# Patient Record
Sex: Female | Born: 1955 | Race: White | Hispanic: No | Marital: Married | Smoking: Former smoker
Health system: Southern US, Community
[De-identification: ages and names within clinical notes are randomized; demographics above are authoritative.]

## PROBLEM LIST (undated history)

## (undated) DIAGNOSIS — J45909 Unspecified asthma, uncomplicated: Secondary | ICD-10-CM

## (undated) DIAGNOSIS — M1990 Unspecified osteoarthritis, unspecified site: Secondary | ICD-10-CM

## (undated) DIAGNOSIS — K219 Gastro-esophageal reflux disease without esophagitis: Secondary | ICD-10-CM

## (undated) DIAGNOSIS — J449 Chronic obstructive pulmonary disease, unspecified: Secondary | ICD-10-CM

## (undated) DIAGNOSIS — I10 Essential (primary) hypertension: Secondary | ICD-10-CM

## (undated) HISTORY — PX: BACK SURGERY: SHX140

---

## 2005-05-30 ENCOUNTER — Ambulatory Visit: Payer: Self-pay | Admitting: Specialist

## 2008-07-28 ENCOUNTER — Emergency Department: Payer: Self-pay | Admitting: Emergency Medicine

## 2016-08-21 ENCOUNTER — Emergency Department: Payer: Medicare Other

## 2016-08-21 ENCOUNTER — Inpatient Hospital Stay
Admission: EM | Admit: 2016-08-21 | Discharge: 2016-08-23 | DRG: 193 | Disposition: A | Discharge status: Home / Self Care | Payer: Medicare Other | Attending: Internal Medicine | Admitting: Internal Medicine

## 2016-08-21 DIAGNOSIS — K219 Gastro-esophageal reflux disease without esophagitis: Secondary | ICD-10-CM | POA: Diagnosis present

## 2016-08-21 DIAGNOSIS — I10 Essential (primary) hypertension: Secondary | ICD-10-CM | POA: Diagnosis present

## 2016-08-21 DIAGNOSIS — J189 Pneumonia, unspecified organism: Principal | ICD-10-CM | POA: Diagnosis present

## 2016-08-21 DIAGNOSIS — J9621 Acute and chronic respiratory failure with hypoxia: Secondary | ICD-10-CM | POA: Diagnosis present

## 2016-08-21 DIAGNOSIS — J181 Lobar pneumonia, unspecified organism: Secondary | ICD-10-CM

## 2016-08-21 DIAGNOSIS — E876 Hypokalemia: Secondary | ICD-10-CM | POA: Diagnosis present

## 2016-08-21 DIAGNOSIS — Z87891 Personal history of nicotine dependence: Secondary | ICD-10-CM

## 2016-08-21 DIAGNOSIS — J441 Chronic obstructive pulmonary disease with (acute) exacerbation: Secondary | ICD-10-CM | POA: Diagnosis present

## 2016-08-21 DIAGNOSIS — D72829 Elevated white blood cell count, unspecified: Secondary | ICD-10-CM

## 2016-08-21 DIAGNOSIS — E871 Hypo-osmolality and hyponatremia: Secondary | ICD-10-CM | POA: Diagnosis present

## 2016-08-21 DIAGNOSIS — E119 Type 2 diabetes mellitus without complications: Secondary | ICD-10-CM

## 2016-08-21 DIAGNOSIS — E440 Moderate protein-calorie malnutrition: Secondary | ICD-10-CM | POA: Diagnosis present

## 2016-08-21 DIAGNOSIS — J440 Chronic obstructive pulmonary disease with acute lower respiratory infection: Secondary | ICD-10-CM | POA: Diagnosis present

## 2016-08-21 DIAGNOSIS — R0781 Pleurodynia: Secondary | ICD-10-CM

## 2016-08-21 DIAGNOSIS — Z881 Allergy status to other antibiotic agents status: Secondary | ICD-10-CM

## 2016-08-21 DIAGNOSIS — R1011 Right upper quadrant pain: Secondary | ICD-10-CM

## 2016-08-21 DIAGNOSIS — I499 Cardiac arrhythmia, unspecified: Secondary | ICD-10-CM

## 2016-08-21 HISTORY — DX: Gastro-esophageal reflux disease without esophagitis: K21.9

## 2016-08-21 HISTORY — DX: Essential (primary) hypertension: I10

## 2016-08-21 HISTORY — DX: Unspecified osteoarthritis, unspecified site: M19.90

## 2016-08-21 HISTORY — DX: Unspecified asthma, uncomplicated: J45.909

## 2016-08-21 HISTORY — DX: Chronic obstructive pulmonary disease, unspecified: J44.9

## 2016-08-21 LAB — BASIC METABOLIC PANEL
ANION GAP: 9 (ref 5–15)
BUN: 9 mg/dL (ref 6–20)
CALCIUM: 9.1 mg/dL (ref 8.9–10.3)
CO2: 31 mmol/L (ref 22–32)
Chloride: 95 mmol/L — ABNORMAL LOW (ref 101–111)
Creatinine, Ser: 0.61 mg/dL (ref 0.44–1.00)
GFR calc non Af Amer: >60 mL/min (ref >60)
Glucose, Bld: 203 mg/dL — ABNORMAL HIGH (ref 65–99)
Potassium: 3.2 mmol/L — ABNORMAL LOW (ref 3.5–5.1)
SODIUM: 135 mmol/L (ref 135–145)

## 2016-08-21 LAB — CBC WITH DIFFERENTIAL/PLATELET
BASOS ABS: 0.1 10*3/uL (ref 0–0.1)
Basophils Relative: 1 %
EOS PCT: 1 %
Eosinophils Absolute: 0.2 10*3/uL (ref 0–0.7)
HEMATOCRIT: 31.3 % — AB (ref 35.0–47.0)
Hemoglobin: 10.8 g/dL — ABNORMAL LOW (ref 12.0–16.0)
Lymphocytes Relative: 11 %
Lymphs Abs: 1.3 10*3/uL (ref 1.0–3.6)
MCH: 29.2 pg (ref 26.0–34.0)
MCHC: 34.7 g/dL (ref 32.0–36.0)
MCV: 84.1 fL (ref 80.0–100.0)
MONO ABS: 1.3 10*3/uL — AB (ref 0.2–0.9)
Monocytes Relative: 11 %
NEUTROS ABS: 9.4 10*3/uL — AB (ref 1.4–6.5)
Neutrophils Relative %: 76 %
PLATELETS: 233 10*3/uL (ref 150–440)
RBC: 3.72 MIL/uL — ABNORMAL LOW (ref 3.80–5.20)
RDW: 13.4 % (ref 11.5–14.5)
WBC: 12.3 10*3/uL — ABNORMAL HIGH (ref 3.6–11.0)

## 2016-08-21 LAB — TROPONIN I: Troponin I: <0.03 ng/mL (ref <0.03)

## 2016-08-21 MED ORDER — ALBUTEROL SULFATE (2.5 MG/3ML) 0.083% IN NEBU
7.5000 mg | INHALATION_SOLUTION | Freq: Once | RESPIRATORY_TRACT | Status: AC
  Administered 2016-08-21: 7.5 mg via RESPIRATORY_TRACT
  Filled 2016-08-21: qty 9

## 2016-08-21 MED ORDER — CEFTRIAXONE SODIUM IN DEXTROSE 20 MG/ML IV SOLN
1.0000 g | Freq: Once | INTRAVENOUS | Status: AC
  Administered 2016-08-21: 1 g via INTRAVENOUS
  Filled 2016-08-21: qty 50

## 2016-08-21 MED ORDER — DEXTROSE 5 % IV SOLN
500.0000 mg | Freq: Once | INTRAVENOUS | Status: AC
  Administered 2016-08-22: 500 mg via INTRAVENOUS
  Filled 2016-08-21: qty 500

## 2016-08-21 MED ORDER — DEXTROSE 5 % IV SOLN
1.0000 g | INTRAVENOUS | Status: DC
  Administered 2016-08-22: 1 g via INTRAVENOUS
  Filled 2016-08-21 (×2): qty 10

## 2016-08-21 MED ORDER — METHYLPREDNISOLONE SODIUM SUCC 125 MG IJ SOLR
125.0000 mg | Freq: Once | INTRAMUSCULAR | Status: AC
  Administered 2016-08-21: 125 mg via INTRAVENOUS
  Filled 2016-08-21: qty 2

## 2016-08-21 MED ORDER — IPRATROPIUM-ALBUTEROL 0.5-2.5 (3) MG/3ML IN SOLN
6.0000 mL | Freq: Once | RESPIRATORY_TRACT | Status: AC
  Administered 2016-08-21: 6 mL via RESPIRATORY_TRACT
  Filled 2016-08-21: qty 6

## 2016-08-21 NOTE — ED Notes (Signed)
Pt found lying on stretcher with nasal cannula no longer on her nose. Pt encouraged to keep it in place. Verbalized understanding. Family not at bedside but said to return shortly.

## 2016-08-21 NOTE — ED Notes (Signed)
Pt bib EMS w/ c/o AMS and SOB.  Per EMS this began yesterday, family reports that pt has been "spacy", and forgetting things.  Pt fidgeting in room, has difficulty staying still for EKG/BP.  Pt A/OX4, has productive cough.  Pt able to move all limbs on command.  Lung sounds diminished

## 2016-08-21 NOTE — H&P (Signed)
[facility] -  at [facility]   PATIENT NAME: Valerie Norris    MR#:  [id number]  DATE OF BIRTH:  March 19, 1956  DATE OF ADMISSION:  08/21/2016  PRIMARY CARE PHYSICIAN: No primary care provider on file.   REQUESTING/REFERRING PHYSICIAN: Schaevitz  CHIEF COMPLAINT:   Chief Complaint  Patient presents with  . Shortness of Breath    HISTORY OF PRESENT ILLNESS:  Valerie Norris  is a 61 y.o. female who presents with Shortness of breath for the last 3 days. Patient has had significant wheezing and cough. She has COPD, and chest x-ray here in the ED shows pneumonia as well. She required significant respiratory support in the ED, though she did not require BiPAP, and hospitalists were called for admission.  PAST MEDICAL HISTORY:   Past Medical History:  Diagnosis Date  . Arthritis   . Asthma   . COPD (chronic obstructive pulmonary disease) (HCC)   . GERD (gastroesophageal reflux disease)   . Hypertension     PAST SURGICAL HISTORY:   Past Surgical History:  Procedure Laterality Date  . BACK SURGERY      SOCIAL HISTORY:   Social History  Substance Use Topics  . Smoking status: Former Games developer  . Smokeless tobacco: Never Used  . Alcohol use No    FAMILY HISTORY:   Family History  Problem Relation Age of Onset  . Heart attack Mother   . COPD Father   . Heart failure Brother     DRUG ALLERGIES:   Allergies  Allergen Reactions  . Tetracyclines & Related Nausea And Vomiting    MEDICATIONS AT HOME:   Prior to Admission medications   Not on File    REVIEW OF SYSTEMS:  Review of Systems  Constitutional: Positive for malaise/fatigue. Negative for chills, fever and weight loss.  HENT: Negative for ear pain, hearing loss and tinnitus.   Eyes: Negative for blurred vision, double vision, pain and redness.  Respiratory: Positive for cough, shortness of breath and wheezing. Negative for hemoptysis.   Cardiovascular: Negative for chest pain,  palpitations, orthopnea and leg swelling.  Gastrointestinal: Negative for abdominal pain, constipation, diarrhea, nausea and vomiting.  Genitourinary: Negative for dysuria, frequency and hematuria.  Musculoskeletal: Negative for back pain, joint pain and neck pain.  Skin:       No acne, rash, or lesions  Neurological: Negative for dizziness, tremors, focal weakness and weakness.  Endo/Heme/Allergies: Negative for polydipsia. Does not bruise/bleed easily.  Psychiatric/Behavioral: Negative for depression. The patient is not nervous/anxious and does not have insomnia.      VITAL SIGNS:   Vitals:   08/21/16 2053 08/21/16 2100  BP: (!) 150/88 (!) 132/54  Pulse: (!) 112 (!) 102  Resp: 17 (!) 28  Temp: 99 F (37.2 C)   TempSrc: Oral   SpO2: 92% 95%  Weight: 53.1 kg (117 lb)   Height: 5\' 4"  (1.626 m)    Wt Readings from Last 3 Encounters:  08/21/16 53.1 kg (117 lb)    PHYSICAL EXAMINATION:  Physical Exam  Vitals reviewed. Constitutional: She is oriented to person, place, and time. She appears well-developed and well-nourished. No distress.  HENT:  Head: Normocephalic and atraumatic.  Mouth/Throat: Oropharynx is clear and moist.  Eyes: Conjunctivae and EOM are normal. Pupils are equal, round, and reactive to light. No scleral icterus.  Neck: Normal range of motion. Neck supple. No JVD present. No thyromegaly present.  Cardiovascular: Normal rate, regular rhythm and intact distal pulses.  Exam reveals  no gallop and no friction rub.   No murmur heard. Respiratory: She is in respiratory distress. She has wheezes. She has no rales.  GI: Soft. Bowel sounds are normal. She exhibits no distension. There is no tenderness.  Musculoskeletal: Normal range of motion. She exhibits no edema.  No arthritis, no gout  Lymphadenopathy:    She has no cervical adenopathy.  Neurological: She is alert and oriented to person, place, and time. No cranial nerve deficit.  No dysarthria, no aphasia   Skin: Skin is warm and dry. No rash noted. No erythema.  Psychiatric: She has a normal mood and affect. Her behavior is normal. Judgment and thought content normal.    LABORATORY PANEL:   CBC  Recent Labs Lab 08/21/16 2059  WBC 12.3*  HGB 10.8*  HCT 31.3*  PLT 233   ------------------------------------------------------------------------------------------------------------------  Chemistries   Recent Labs Lab 08/21/16 2059  NA 135  K 3.2*  CL 95*  CO2 31  GLUCOSE 203*  BUN 9  CREATININE 0.61  CALCIUM 9.1   ------------------------------------------------------------------------------------------------------------------  Cardiac Enzymes  Recent Labs Lab 08/21/16 2059  TROPONINI <0.03   ------------------------------------------------------------------------------------------------------------------  RADIOLOGY:  Dg Chest 1 View  Result Date: 08/21/2016 CLINICAL DATA:  Shortness of breath EXAM: CHEST 1 VIEW COMPARISON:  CT 05/30/2005 FINDINGS: Hyperinflation with emphysematous disease. Mild opacity at the right lateral lung base may reflect inflammatory infiltrate. No effusion. Normal heart size. No pneumothorax. IMPRESSION: Hyperinflation with emphysematous disease. Mild right lateral basilar opacity may reflect pulmonary infiltrate. Electronically Signed   By: Jasmine PangKim  Fujinaga M.D.   On: 08/21/2016 22:01    EKG:  No orders found for this or any previous visit.  IMPRESSION AND PLAN:  Principal Problem:   CAP (community acquired pneumonia) - IV antibiotics, patient does not meet sepsis criteria, treat COPD exacerbation as below, supportive treatment with antitussives and duo nebs when necessary Active Problems:   COPD with acute exacerbation (HCC) - IV steroids, DuoNeb's and antitussives, continue home meds   HTN (hypertension) - stable, continue home meds   GERD (gastroesophageal reflux disease) - home dose PPI  All the records are reviewed and case discussed  with ED provider. Management plans discussed with the patient and/or family.  DVT PROPHYLAXIS: SubQ lovenox  GI PROPHYLAXIS: PPI  ADMISSION STATUS: Inpatient  CODE STATUS: Full Code Status History    This patient does not have a recorded code status. Please follow your organizational policy for patients in this situation.      TOTAL TIME TAKING CARE OF THIS PATIENT: 45 minutes.   Valerie Norris FIELDING 08/21/2016, 10:39 PM  [facility]  Office  [phone]  CC: Primary care physician; No primary care provider on file.  Note:  This document was prepared using Dragon voice recognition software and may include unintentional dictation errors.

## 2016-08-21 NOTE — ED Provider Notes (Signed)
[facility] Emergency Department Provider Note  ____________________________________________   First MD Initiated Contact with Patient 08/21/16 2045     (approximate)  I have reviewed the triage vital signs and the nursing notes.   HISTORY  Chief Complaint Shortness of Breath   HPI Valerie Norris is a 61 y.o. female with a history of COPD on 3 L of nasal cannula oxygen at her baseline who is presenting with hypoxia as well as increasing shortness of breath today. Family also states that she has had staring spells. When EMS arrived the patient was found to be 84% on her home oxygen. Complained of some pain to EMS arrival. However, denies any chest pain here in the emergency department. Does not report fever.   Past Medical History:  Diagnosis Date  . Arthritis   . Asthma   . COPD (chronic obstructive pulmonary disease) (HCC)   . Hypertension     There are no active problems to display for this patient.   Past Surgical History:  Procedure Laterality Date  . BACK SURGERY      Prior to Admission medications   Not on File    Allergies Tetracyclines & related  No family history on file.  Social History Social History  Substance Use Topics  . Smoking status: Former Games developer  . Smokeless tobacco: Never Used  . Alcohol use Not on file    Review of Systems  Constitutional: No fever/chills Eyes: No visual changes. ENT: No sore throat. Cardiovascular: Denies chest pain. Respiratory: as above  Gastrointestinal: No abdominal pain.  No nausea, no vomiting.  No diarrhea.  No constipation. Genitourinary: Negative for dysuria. Musculoskeletal: Negative for back pain. Skin: Negative for rash. Neurological: Negative for headaches, focal weakness or numbness.   ____________________________________________   PHYSICAL EXAM:  VITAL SIGNS: ED Triage Vitals  Enc Vitals Group     BP 08/21/16 2053 (!) 150/88     Pulse Rate 08/21/16 2053 (!) 112       Resp 08/21/16 2053 17     Temp 08/21/16 2053 99 F (37.2 C)     Temp Source 08/21/16 2053 Oral     SpO2 08/21/16 2053 92 %     Weight 08/21/16 2053 117 lb (53.1 kg)     Height 08/21/16 2053 5\' 4"  (1.626 m)     Head Circumference --      Peak Flow --      Pain Score 08/21/16 2047 4     Pain Loc --      Pain Edu? --      Excl. in GC? --     Constitutional: Alert and oriented.  Eyes: Conjunctivae are normal.  Head: Atraumatic. Nose: No congestion/rhinnorhea. Mouth/Throat: Mucous membranes are moist.  Neck: No stridor.   Cardiovascular: Normal rate, regular rhythm. Grossly normal heart sounds.   Respiratory: Labored respirations with mild supraclavicular retraction.  decreased air movement throughout with prolonged history phase and coarse wheezing. Gastrointestinal: Soft and nontender. No distention.  Musculoskeletal: No lower extremity tenderness nor edema.   Neurologic:  Normal speech and language. No gross focal neurologic deficits are appreciated.  Skin:  Skin is warm, dry and intact. No rash noted. Psychiatric: Mood and affect are normal. Speech and behavior are normal.  ____________________________________________   LABS (all labs ordered are listed, but only abnormal results are displayed)  Labs Reviewed  CBC WITH DIFFERENTIAL/PLATELET - Abnormal; Notable for the following:       Result Value   WBC 12.3 (*)  RBC 3.72 (*)    Hemoglobin 10.8 (*)    HCT 31.3 (*)    Neutro Abs 9.4 (*)    Monocytes Absolute 1.3 (*)    All other components within normal limits  BASIC METABOLIC PANEL  TROPONIN I   ____________________________________________  EKG  ED ECG REPORT I, Pegah Segel,  Teena Iraniavid M, the attending physician, personally viewed and interpreted this ECG.   Date: 08/21/2016  EKG Time: 2051  Rate: 108  Rhythm: sinus tachycardia  Axis: normal  Intervals:none  ST&T Change: No ST segment elevation or depression. No abnormal T-wave  inversion.  ____________________________________________  RADIOLOGY  Right lower lobe pneumonia. ____________________________________________   PROCEDURES  Procedure(s) performed:   Procedures  Critical Care performed:   ____________________________________________   INITIAL IMPRESSION / ASSESSMENT AND PLAN / ED COURSE  Pertinent labs & imaging results that were available during my care of the patient were reviewed by me and considered in my medical decision making (see chart for details).  ----------------------------------------- 10:13 PM on 08/21/2016 -----------------------------------------  Patient with persistent wheezing despite nebs and steroids. We will give further albuterol in the emergency department. Will be admitted to the hospital. We'll also treat for community acquired pneumonia. Patient has not been admitted to the hospital for "years." Explained the plan as well as the diagnosis patient as well as the family. Site of the Dr. Anne HahnWillis.      ____________________________________________   FINAL CLINICAL IMPRESSION(S) / ED DIAGNOSES  COPD exacerbation. Community acquired pneumonia.    NEW MEDICATIONS STARTED DURING THIS VISIT:  New Prescriptions   No medications on file     Note:  This document was prepared using Dragon voice recognition software and may include unintentional dictation errors.    Myrna BlazerSchaevitz, Izella Ybanez Matthew, MD 08/21/16 2214

## 2016-08-22 ENCOUNTER — Inpatient Hospital Stay: Payer: Medicare Other

## 2016-08-22 DIAGNOSIS — E440 Moderate protein-calorie malnutrition: Secondary | ICD-10-CM | POA: Insufficient documentation

## 2016-08-22 LAB — HEPATIC FUNCTION PANEL
ALBUMIN: 3.2 g/dL — AB (ref 3.5–5.0)
ALK PHOS: 69 U/L (ref 38–126)
ALT: 9 U/L — AB (ref 14–54)
AST: 14 U/L — ABNORMAL LOW (ref 15–41)
BILIRUBIN TOTAL: 0.3 mg/dL (ref 0.3–1.2)
Bilirubin, Direct: <0.1 mg/dL — ABNORMAL LOW (ref 0.1–0.5)
Total Protein: 6.6 g/dL (ref 6.5–8.1)

## 2016-08-22 LAB — BASIC METABOLIC PANEL
Anion gap: 7 (ref 5–15)
BUN: 8 mg/dL (ref 6–20)
CALCIUM: 8.9 mg/dL (ref 8.9–10.3)
CO2: 33 mmol/L — ABNORMAL HIGH (ref 22–32)
Chloride: 94 mmol/L — ABNORMAL LOW (ref 101–111)
Creatinine, Ser: 0.53 mg/dL (ref 0.44–1.00)
GFR calc Af Amer: >60 mL/min (ref >60)
GLUCOSE: 347 mg/dL — AB (ref 65–99)
Potassium: 3.5 mmol/L (ref 3.5–5.1)
Sodium: 134 mmol/L — ABNORMAL LOW (ref 135–145)

## 2016-08-22 LAB — GLUCOSE, CAPILLARY
GLUCOSE-CAPILLARY: 220 mg/dL — AB (ref 65–99)
Glucose-Capillary: 239 mg/dL — ABNORMAL HIGH (ref 65–99)

## 2016-08-22 LAB — EXPECTORATED SPUTUM ASSESSMENT W GRAM STAIN, RFLX TO RESP C

## 2016-08-22 LAB — CBC
HCT: 28.7 % — ABNORMAL LOW (ref 35.0–47.0)
Hemoglobin: 9.9 g/dL — ABNORMAL LOW (ref 12.0–16.0)
MCH: 29.1 pg (ref 26.0–34.0)
MCHC: 34.6 g/dL (ref 32.0–36.0)
MCV: 84.1 fL (ref 80.0–100.0)
Platelets: 201 10*3/uL (ref 150–440)
RBC: 3.42 MIL/uL — ABNORMAL LOW (ref 3.80–5.20)
RDW: 12.9 % (ref 11.5–14.5)
WBC: 7.3 10*3/uL (ref 3.6–11.0)

## 2016-08-22 LAB — EXPECTORATED SPUTUM ASSESSMENT W REFEX TO RESP CULTURE

## 2016-08-22 MED ORDER — OXYCODONE HCL 5 MG PO TABS
15.0000 mg | ORAL_TABLET | Freq: Four times a day (QID) | ORAL | Status: DC
  Administered 2016-08-22 – 2016-08-23 (×7): 15 mg via ORAL
  Filled 2016-08-22 (×7): qty 3

## 2016-08-22 MED ORDER — TIOTROPIUM BROMIDE MONOHYDRATE 18 MCG IN CAPS
1.0000 | ORAL_CAPSULE | Freq: Every day | RESPIRATORY_TRACT | Status: DC
  Administered 2016-08-22 – 2016-08-23 (×2): 18 ug via RESPIRATORY_TRACT
  Filled 2016-08-22: qty 5

## 2016-08-22 MED ORDER — ONDANSETRON HCL 4 MG PO TABS: 4.0000 mg | ORAL_TABLET | Freq: Four times a day (QID) | ORAL | Status: DC | PRN

## 2016-08-22 MED ORDER — OXYCODONE HCL 5 MG PO TABS: 15.0000 mg | ORAL_TABLET | Freq: Four times a day (QID) | ORAL | Status: DC

## 2016-08-22 MED ORDER — GABAPENTIN 400 MG PO CAPS
800.0000 mg | ORAL_CAPSULE | Freq: Two times a day (BID) | ORAL | Status: DC
  Administered 2016-08-22 – 2016-08-23 (×3): 800 mg via ORAL
  Filled 2016-08-22 (×5): qty 2

## 2016-08-22 MED ORDER — POTASSIUM CHLORIDE IN NACL 20-0.9 MEQ/L-% IV SOLN
INTRAVENOUS | Status: DC
  Administered 2016-08-22 – 2016-08-23 (×2): via INTRAVENOUS
  Filled 2016-08-22 (×2): qty 1000

## 2016-08-22 MED ORDER — ACETAMINOPHEN 650 MG RE SUPP: 650.0000 mg | Freq: Four times a day (QID) | RECTAL | Status: DC | PRN

## 2016-08-22 MED ORDER — SERTRALINE HCL 100 MG PO TABS
200.0000 mg | ORAL_TABLET | Freq: Every day | ORAL | Status: DC
  Administered 2016-08-22 – 2016-08-23 (×2): 200 mg via ORAL
  Filled 2016-08-22 (×2): qty 2

## 2016-08-22 MED ORDER — METHYLPREDNISOLONE SODIUM SUCC 125 MG IJ SOLR
60.0000 mg | Freq: Four times a day (QID) | INTRAMUSCULAR | Status: DC
  Administered 2016-08-22 – 2016-08-23 (×6): 60 mg via INTRAVENOUS
  Filled 2016-08-22 (×6): qty 2

## 2016-08-22 MED ORDER — OXYBUTYNIN CHLORIDE 5 MG PO TABS
5.0000 mg | ORAL_TABLET | Freq: Two times a day (BID) | ORAL | Status: DC
  Administered 2016-08-22 – 2016-08-23 (×4): 5 mg via ORAL
  Filled 2016-08-22 (×4): qty 1

## 2016-08-22 MED ORDER — GLUCERNA SHAKE PO LIQD
237.0000 mL | Freq: Three times a day (TID) | ORAL | Status: DC
  Administered 2016-08-22 – 2016-08-23 (×4): 237 mL via ORAL

## 2016-08-22 MED ORDER — ENOXAPARIN SODIUM 40 MG/0.4ML ~~LOC~~ SOLN
40.0000 mg | SUBCUTANEOUS | Status: DC
  Administered 2016-08-22: 40 mg via SUBCUTANEOUS
  Filled 2016-08-22: qty 0.4

## 2016-08-22 MED ORDER — MOMETASONE FURO-FORMOTEROL FUM 100-5 MCG/ACT IN AERO
2.0000 | INHALATION_SPRAY | Freq: Two times a day (BID) | RESPIRATORY_TRACT | Status: DC
  Administered 2016-08-22 – 2016-08-23 (×4): 2 via RESPIRATORY_TRACT
  Filled 2016-08-22: qty 8.8

## 2016-08-22 MED ORDER — GUAIFENESIN-DM 100-10 MG/5ML PO SYRP: 5.0000 mL | ORAL_SOLUTION | ORAL | Status: DC | PRN

## 2016-08-22 MED ORDER — ORAL CARE MOUTH RINSE
15.0000 mL | Freq: Two times a day (BID) | OROMUCOSAL | Status: DC
Start: 2016-08-22 — End: 2016-08-23
  Administered 2016-08-22 – 2016-08-23 (×3): 15 mL via OROMUCOSAL

## 2016-08-22 MED ORDER — IPRATROPIUM-ALBUTEROL 0.5-2.5 (3) MG/3ML IN SOLN
3.0000 mL | RESPIRATORY_TRACT | Status: DC
  Administered 2016-08-22 – 2016-08-23 (×8): 3 mL via RESPIRATORY_TRACT
  Filled 2016-08-22 (×9): qty 3

## 2016-08-22 MED ORDER — INSULIN ASPART 100 UNIT/ML ~~LOC~~ SOLN
0.0000 [IU] | Freq: Three times a day (TID) | SUBCUTANEOUS | Status: DC
  Administered 2016-08-22: 3 [IU] via SUBCUTANEOUS
  Administered 2016-08-23 (×2): 2 [IU] via SUBCUTANEOUS
  Filled 2016-08-22 (×3): qty 2
  Filled 2016-08-22: qty 3

## 2016-08-22 MED ORDER — INSULIN ASPART 100 UNIT/ML ~~LOC~~ SOLN
0.0000 [IU] | Freq: Every day | SUBCUTANEOUS | Status: DC
  Administered 2016-08-23: 2 [IU] via SUBCUTANEOUS

## 2016-08-22 MED ORDER — ACETAMINOPHEN 325 MG PO TABS: 650.0000 mg | ORAL_TABLET | Freq: Four times a day (QID) | ORAL | Status: DC | PRN

## 2016-08-22 MED ORDER — BENZONATATE 100 MG PO CAPS: 200.0000 mg | ORAL_CAPSULE | Freq: Three times a day (TID) | ORAL | Status: DC | PRN

## 2016-08-22 MED ORDER — ONDANSETRON HCL 4 MG/2ML IJ SOLN
4.0000 mg | Freq: Four times a day (QID) | INTRAMUSCULAR | Status: DC | PRN
  Administered 2016-08-22: 4 mg via INTRAVENOUS
  Filled 2016-08-22: qty 2

## 2016-08-22 MED ORDER — THEOPHYLLINE ER 300 MG PO TB12
300.0000 mg | ORAL_TABLET | Freq: Every day | ORAL | Status: DC
  Administered 2016-08-22 – 2016-08-23 (×2): 300 mg via ORAL
  Filled 2016-08-22 (×2): qty 1

## 2016-08-22 MED ORDER — PANTOPRAZOLE SODIUM 40 MG PO TBEC
40.0000 mg | DELAYED_RELEASE_TABLET | Freq: Every day | ORAL | Status: DC
  Administered 2016-08-22 – 2016-08-23 (×2): 40 mg via ORAL
  Filled 2016-08-22 (×2): qty 1

## 2016-08-22 MED ORDER — DEXTROSE 5 % IV SOLN
500.0000 mg | INTRAVENOUS | Status: DC
  Administered 2016-08-22: 500 mg via INTRAVENOUS
  Filled 2016-08-22 (×2): qty 500

## 2016-08-22 MED ORDER — ADULT MULTIVITAMIN W/MINERALS CH
1.0000 | ORAL_TABLET | Freq: Every day | ORAL | Status: DC
  Administered 2016-08-22 – 2016-08-23 (×2): 1 via ORAL
  Filled 2016-08-22 (×2): qty 1

## 2016-08-22 NOTE — Progress Notes (Signed)
Inpatient Diabetes Program Recommendations  AACE/ADA: New Consensus Statement on Inpatient Glycemic Control (2015)  Target Ranges:  Prepandial:   less than 140 mg/dL      Peak postprandial:   less than 180 mg/dL (1-2 hours)      Critically ill patients:  140 - 180 mg/dL   Results for Valerie Norris, Valerie Norris (MRN [id number]) as of 08/22/2016 12:01  Ref. Range 08/21/2016 20:59 08/22/2016 05:39  Glucose Latest Ref Range: 65 - 99 mg/dL 409203 (H) 811347 (H)   Review of Glycemic Control  Diabetes history: No Outpatient Diabetes medications: NA Current orders for Inpatient glycemic control: None  Inpatient Diabetes Program Recommendations: Correction (SSI): Please consider ordering CBGs with Novolog 0-15 units TID with meals and Novolog 0-5 units QHS. HgbA1C: Please consider ordering an A1C to evaluate glycemic control.  Thanks, Orlando PennerMarie Aly Seidenberg, RN, MSN, CDE Diabetes Coordinator Inpatient Diabetes Program [phone] (Team Pager from 8am to 5pm)

## 2016-08-22 NOTE — Progress Notes (Signed)
Pharmacy Antibiotic Note  Valerie EndLinda Norris is a 61 y.o. female admitted on 08/21/2016 with pneumonia.  Pharmacy has been consulted for cwftriaxone dosing.  Plan: Ceftriaxone 1 gram q 24 hours ordered.  Height: 5\' 4"  (162.6 cm) Weight: 117 lb (53.1 kg) IBW/kg (Calculated) : 54.7  Temp (24hrs), Avg:99 F (37.2 C), Min:99 F (37.2 C), Max:99 F (37.2 C)   Recent Labs Lab 08/21/16 2059  WBC 12.3*  CREATININE 0.61    Estimated Creatinine Clearance: 60.3 mL/min (by C-G formula based on SCr of 0.61 mg/dL).    Allergies  Allergen Reactions  . Methadone     Other reaction(s): Unknown  . Tetracyclines & Related Nausea And Vomiting  . Latex Rash    Antimicrobials this admission: Ceftriaxone azithromycin 5/16  >>    >>   Dose adjustments this admission:   Microbiology results: No micro      5/16 CXR: R basilar opacity  Thank you for allowing pharmacy to be a part of this patient's care.  Valerie Norris S 08/22/2016 1:29 AM

## 2016-08-22 NOTE — Progress Notes (Signed)
Initial Nutrition Assessment  DOCUMENTATION CODES:   Non-severe (moderate) malnutrition in context of chronic illness  INTERVENTION:   Glucerna Shake po TID, each supplement provides 220 kcal and 10 grams of protein  MVI  NUTRITION DIAGNOSIS:   Malnutrition (moderate) related to catabolic illness, other (see comment) (COPD) as evidenced by moderate depletions of body fat in arms and chest and moderate to severe depletions of muscle mass over entire body.  GOAL:   Patient will meet greater than or equal to 90% of their needs  MONITOR:   PO intake, Supplement acceptance, Labs, Weight trends  REASON FOR ASSESSMENT:   Malnutrition Screening Tool    ASSESSMENT:   61 y/o female with h/o COPD admitted for CAP   Met with pt in room today. Pt reports decreased appetite and poor oral intake for several months pta. Per chart, pt has lost 16lbs(12%) in one year and 8lbs(6%) in 5 months; which is not significant. RD discussed with pt the importance of adequate protein intake to preserve lean muscle mass. Pt likes butter pecan Glucerna; RD will order. Encourage intake of meals and supplements. Pt currently NPO for scheduled CT exam later today. Pt eating <25% meals yesterday.   Medications reviewed and include: lovenox, neurontin, insulin, solu-medrol, oxycodone, protonix, azithromycin, ceftriaxone  Labs reviewed: Na 134(L), Cl 94(L), CO2 33(H), alb 3.2(L) Hgb 9.9(L), Hct 28.7(L) cbgs- 203, 347 x 24hrs  Nutrition-Focused physical exam completed. Findings are moderate fat depletion in upper arms and chest, moderate to severe muscle depletion over entire body, and mild edema.   Diet Order:  Diet Carb Modified Fluid consistency: Thin; Room service appropriate? Yes  Skin:  Reviewed, no issues  Last BM:  5/16  Height:   Ht Readings from Last 1 Encounters:  08/21/16 _0  (1.626 m)    Weight:   Wt Readings from Last 1 Encounters:  08/21/16 117 lb (53.1 kg)    Ideal Body  Weight:  54.5 kg  BMI:  Body mass index is 20.08 kg/m.  Estimated Nutritional Needs:   Kcal:  1800-2100kcal/day   Protein:  95-106g/day  Fluid:  >1.8L/day   EDUCATION NEEDS:   Education needs addressed  Koleen Distance MS, RD, LDN Pager #[phone]

## 2016-08-22 NOTE — Progress Notes (Signed)
[facility] - [place] at [facility]   PATIENT NAME: Valerie Norris    MR#:  [id number]  DATE OF BIRTH:  09/12/1955  SUBJECTIVE:  CHIEF COMPLAINT:   Chief Complaint  Patient presents with  . Shortness of Breath   Patient is 61 year old Caucasian female with past medical history significant for history of asthma, COPD, hypertension, gastroesophageal reflux disease, arthritis, who presented to the hospital with complaints of shortness of breath for 3 days, wheezing, cough, pain with respirations in bilateral lower lungs, mostly on the right. The patient was tachypneic, tachycardic, hypoxic on arrival to the hospital requiring 4 L of oxygen. Her chest x-ray revealed emphysema, right basilar opacity, likely pneumonia. Patient feels satisfactory today, complains of right lower quadrant abdominal pain, right lower chest area pain with breathing. Right upper quadrant ultrasound revealed cholelithiasis, no common bile duct dilatation. The patient admits of greenish phlegm production   Review of Systems  Constitutional: Negative for chills, fever and weight loss.  HENT: Negative for congestion.   Eyes: Negative for blurred vision and double vision.  Respiratory: Positive for cough, sputum production, shortness of breath and wheezing.   Cardiovascular: Positive for chest pain. Negative for palpitations, orthopnea, leg swelling and PND.  Gastrointestinal: Negative for abdominal pain, blood in stool, constipation, diarrhea, nausea and vomiting.  Genitourinary: Negative for dysuria, frequency, hematuria and urgency.  Musculoskeletal: Negative for falls.  Neurological: Negative for dizziness, tremors, focal weakness and headaches.  Endo/Heme/Allergies: Does not bruise/bleed easily.  Psychiatric/Behavioral: Negative for depression. The patient does not have insomnia.     VITAL SIGNS: Blood pressure 121/90, pulse (!) 113, temperature 97.6 F (36.4 C), temperature source  Oral, resp. rate 18, height 5\' 4"  (1.626 m), weight 53.1 kg (117 lb), SpO2 96 %.  PHYSICAL EXAMINATION:   GENERAL:  61 y.o.-year-old patient lying in the bed  In mild respiratory distress.  EYES: Pupils equal, round, reactive to light and accommodation. No scleral icterus. Extraocular muscles intact.  HEENT: Head atraumatic, normocephalic. Oropharynx and nasopharynx clear.  NECK:  Supple, no jugular venous distention. No thyroid enlargement, no tenderness..  Lungs reveal diminished breath bilaterally, some scattered wheezing, , fewrales,rhon, But no crepitations . , intermittent use of accessory muscles of respiration.  CARDIOVASCULAR: S1, S2 normal. No murmurs, rubs, or gallops.  ABDOMEN: Soft, tender in the right upper quadrant but no rebound or guarding was noted. Some mild discomfort in left upper quadrant palpation as well, no voluntary guarding, nondistended. Bowel sounds present. No organomegaly or mass.  EXTREMITIES: No pedal edema, cyanosis, or clubbing.  NEUROLOGIC: Cranial nerves II through XII are intact. Muscle strength 5/5 in all extremities. Sensation intact. Gait not checked.  PSYCHIATRIC: The patient is alert and oriented x 3.  SKIN: No obvious rash, lesion, or ulcer.   ORDERS/RESULTS REVIEWED:   CBC  Recent Labs Lab 08/21/16 2059 08/22/16 0539  WBC 12.3* 7.3  HGB 10.8* 9.9*  HCT 31.3* 28.7*  PLT 233 201  MCV 84.1 84.1  MCH 29.2 29.1  MCHC 34.7 34.6  RDW 13.4 12.9  LYMPHSABS 1.3  --   MONOABS 1.3*  --   EOSABS 0.2  --   BASOSABS 0.1  --    ------------------------------------------------------------------------------------------------------------------  Chemistries   Recent Labs Lab 08/21/16 2059 08/22/16 0539  NA 135 134*  K 3.2* 3.5  CL 95* 94*  CO2 31 33*  GLUCOSE 203* 347*  BUN 9 8  CREATININE 0.61 0.53  CALCIUM 9.1 8.9  AST  --  14*  ALT  --  9*  ALKPHOS  --  69  BILITOT  --  0.3    ------------------------------------------------------------------------------------------------------------------ estimated creatinine clearance is 60.3 mL/min (by C-G formula based on SCr of 0.53 mg/dL). ------------------------------------------------------------------------------------------------------------------ No results for input(s): TSH, T4TOTAL, T3FREE, THYROIDAB in the last 72 hours.  Invalid input(s): FREET3  Cardiac Enzymes  Recent Labs Lab 08/21/16 2059  TROPONINI <0.03   ------------------------------------------------------------------------------------------------------------------ Invalid input(s): POCBNP ---------------------------------------------------------------------------------------------------------------  RADIOLOGY: Dg Chest 1 View  Result Date: 08/21/2016 CLINICAL DATA:  Shortness of breath EXAM: CHEST 1 VIEW COMPARISON:  CT 05/30/2005 FINDINGS: Hyperinflation with emphysematous disease. Mild opacity at the right lateral lung base may reflect inflammatory infiltrate. No effusion. Normal heart size. No pneumothorax. IMPRESSION: Hyperinflation with emphysematous disease. Mild right lateral basilar opacity may reflect pulmonary infiltrate. Electronically Signed   By: Jasmine Pang M.D.   On: 08/21/2016 22:01   US Abdomen Limited Ruq  Result Date: 08/22/2016 CLINICAL DATA:  Right upper quadrant abdominal pain EXAM: US ABDOMEN LIMITED - RIGHT UPPER QUADRANT COMPARISON:  None. FINDINGS: Gallbladder: Multiple shadowing stones in the gallbladder, measuring up to 1.1 cm. Normal wall thickness. Negative sonographic Murphy. Common bile duct: Diameter: 3 mm Liver: No focal lesion identified. Within normal limits in parenchymal echogenicity. IMPRESSION: Cholelithiasis without sonographic evidence for acute cholecystitis. No biliary dilatation. Electronically Signed   By: Jasmine Pang M.D.   On: 08/22/2016 15:01    EKG: No orders found for this or any previous  visit.  ASSESSMENT AND PLAN:  Principal Problem:   CAP (community acquired pneumonia) Active Problems:   COPD with acute exacerbation (HCC)   HTN (hypertension)   GERD (gastroesophageal reflux disease)   Malnutrition of moderate degree   #1 right lower lobe pneumonia, continue patient on Rocephin and Zithromax, get sputum cultures if possible   #2. Acute respiratory failure with hypoxia due to COPD exacerbation due to pneumonia, continued on nebs every 4 hours, Solu-Medrol intravenously, Dulera, Spiriva, wean off oxygen as tolerated  #3. Right upper quadrant abdominal pain, no acute cholecystitis noted on ultrasound, but cholelithiasis, no choledocholithiasis,, pain is likely related to right lower lobe pneumonia, supportive therapy. Liver enzymes were unremarkable #4. Hyponatremia, likely due to hyperglycemia, initiate patient on IV fluids, follow sodium level in the morning  #5. 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  #6. Leukocytosis, resolved  . #7. Anemia, follow with hydration, get Hemoccult  Management plans discussed with the patient, family and they are in agreement.   DRUG ALLERGIES:  Allergies  Allergen Reactions  . Methadone     Other reaction(s): Unknown  . Tetracyclines & Related Nausea And Vomiting  . Latex Rash    CODE STATUS:     Code Status Orders        Start     Ordered   08/22/16 0121  Full code  Continuous     08/22/16 0120    Code Status History    Date Active Date Inactive Code Status Order ID Comments User Context   This patient has a current code status but no historical code status.      TOTAL TIME TAKING CARE OF THIS . Patient 40 minutes     Jhamir Pickup M.D on 08/22/2016 at 3:59 PM  Between 7am to 6pm - Pager - [phone]  After 6pm go to [email or web address] - password [email or web address]  [facility]  Office  [phone]  CC: Primary care physician; System, Pcp Not In

## 2016-08-23 DIAGNOSIS — D72829 Elevated white blood cell count, unspecified: Secondary | ICD-10-CM

## 2016-08-23 DIAGNOSIS — J189 Pneumonia, unspecified organism: Secondary | ICD-10-CM

## 2016-08-23 DIAGNOSIS — E119 Type 2 diabetes mellitus without complications: Secondary | ICD-10-CM

## 2016-08-23 DIAGNOSIS — I499 Cardiac arrhythmia, unspecified: Secondary | ICD-10-CM

## 2016-08-23 DIAGNOSIS — J9621 Acute and chronic respiratory failure with hypoxia: Secondary | ICD-10-CM

## 2016-08-23 DIAGNOSIS — E871 Hypo-osmolality and hyponatremia: Secondary | ICD-10-CM

## 2016-08-23 DIAGNOSIS — J181 Lobar pneumonia, unspecified organism: Secondary | ICD-10-CM

## 2016-08-23 DIAGNOSIS — R0781 Pleurodynia: Secondary | ICD-10-CM

## 2016-08-23 DIAGNOSIS — E876 Hypokalemia: Secondary | ICD-10-CM

## 2016-08-23 LAB — BASIC METABOLIC PANEL
Anion gap: 7 (ref 5–15)
BUN: 11 mg/dL (ref 6–20)
CALCIUM: 8.6 mg/dL — AB (ref 8.9–10.3)
CHLORIDE: 97 mmol/L — AB (ref 101–111)
CO2: 30 mmol/L (ref 22–32)
CREATININE: 0.52 mg/dL (ref 0.44–1.00)
GFR calc Af Amer: >60 mL/min (ref >60)
GFR calc non Af Amer: >60 mL/min (ref >60)
GLUCOSE: 227 mg/dL — AB (ref 65–99)
Potassium: 4.1 mmol/L (ref 3.5–5.1)
Sodium: 134 mmol/L — ABNORMAL LOW (ref 135–145)

## 2016-08-23 LAB — HEMOGLOBIN: Hemoglobin: 9.1 g/dL — ABNORMAL LOW (ref 12.0–16.0)

## 2016-08-23 LAB — GLUCOSE, CAPILLARY
GLUCOSE-CAPILLARY: 171 mg/dL — AB (ref 65–99)
Glucose-Capillary: 154 mg/dL — ABNORMAL HIGH (ref 65–99)

## 2016-08-23 LAB — HEMOGLOBIN A1C
Hgb A1c MFr Bld: 5.4 % (ref 4.8–5.6)
MEAN PLASMA GLUCOSE: 108 mg/dL

## 2016-08-23 LAB — MAGNESIUM: Magnesium: 1.6 mg/dL — ABNORMAL LOW (ref 1.7–2.4)

## 2016-08-23 MED ORDER — GUAIFENESIN-DM 100-10 MG/5ML PO SYRP: 5.0000 mL | ORAL_SOLUTION | ORAL | 0 refills | Status: AC | PRN

## 2016-08-23 MED ORDER — MAGNESIUM SULFATE 4 GM/100ML IV SOLN
4.0000 g | Freq: Once | INTRAVENOUS | Status: AC
  Administered 2016-08-23: 4 g via INTRAVENOUS
  Filled 2016-08-23: qty 100

## 2016-08-23 MED ORDER — IPRATROPIUM-ALBUTEROL 0.5-2.5 (3) MG/3ML IN SOLN: 3.0000 mL | RESPIRATORY_TRACT | 3 refills | Status: AC

## 2016-08-23 MED ORDER — GLUCERNA SHAKE PO LIQD: 237.0000 mL | Freq: Three times a day (TID) | ORAL | 0 refills | Status: AC

## 2016-08-23 MED ORDER — LEVOFLOXACIN 750 MG PO TABS: 750.0000 mg | ORAL_TABLET | Freq: Every day | ORAL | 0 refills | Status: AC

## 2016-08-23 MED ORDER — PREDNISONE 10 MG (21) PO TBPK: ORAL_TABLET | ORAL | 0 refills | Status: DC

## 2016-08-23 MED ORDER — AZITHROMYCIN 250 MG PO TABS: 500.0000 mg | ORAL_TABLET | Freq: Every day | ORAL | Status: DC

## 2016-08-23 MED ORDER — MAGNESIUM OXIDE 400 (241.3 MG) MG PO TABS: 400.0000 mg | ORAL_TABLET | Freq: Every day | ORAL | 1 refills | Status: AC

## 2016-08-23 NOTE — Progress Notes (Signed)
Inpatient Diabetes Program Recommendations  AACE/ADA: New Consensus Statement on Inpatient Glycemic Control (2015)  Target Ranges:  Prepandial:   less than 140 mg/dL      Peak postprandial:   less than 180 mg/dL (1-2 hours)      Critically ill patients:  140 - 180 mg/dL   Results for Carmie EndENLAND, Temekia (MRN [id number]) as of 08/23/2016 09:41  Ref. Range 08/22/2016 16:36 08/22/2016 23:36 08/23/2016 07:32  Glucose-Capillary Latest Ref Range: 65 - 99 mg/dL 409239 (H) 811220 (H) 914154 (H)   Review of Glycemic Control  Current orders for Inpatient glycemic control: Novolog 0-9 units TID with meals, Novolog 0-5 units QHS  Inpatient Diabetes Program Recommendations: Insulin - Meal Coverage: While ordered steroids, please consider ordering Novolog 4 units TID with meals for meal coverage. HgbA1C: A1C 5.4% on 08/22/16 indicating an average glucose of 108 mg/dl over the past 2-3 months.   Thanks, Orlando PennerMarie Reta Norgren, RN, MSN, CDE Diabetes Coordinator Inpatient Diabetes Program [phone] (Team Pager from 8am to 5pm)

## 2016-08-23 NOTE — Progress Notes (Signed)
Pharmacy Antibiotic Note  Valerie Norris is a 61 y.o. female admitted on 08/21/2016 with pneumonia.  Pharmacy has been consulted for ceftriaxone dosing.  This is day #3 of antibiotics  Plan: Continue ceftriaxone 1 g IV q24h Changed azithromycin 500 mg from IV to PO per protocol  Height: 5\' 4"  (162.6 cm) Weight: 117 lb (53.1 kg) IBW/kg (Calculated) : 54.7  Temp (24hrs), Avg:97.9 F (36.6 C), Min:97.6 F (36.4 C), Max:98 F (36.7 C)   Recent Labs Lab 08/21/16 2059 08/22/16 0539 08/23/16 0540  WBC 12.3* 7.3  --   CREATININE 0.61 0.53 0.52    Estimated Creatinine Clearance: 60.3 mL/min (by C-G formula based on SCr of 0.52 mg/dL).    Allergies  Allergen Reactions  . Methadone     Other reaction(s): Unknown  . Tetracyclines & Related Nausea And Vomiting  . Latex Rash    Antimicrobials this admission: Ceftriaxone azithromycin 5/16  >>   Microbiology results: 5/17 Respiratory: Pending  5/16 CXR: R basilar opacity  Thank you for allowing pharmacy to be a part of this patient's care.  Valerie Norris, PharmD 08/23/2016 11:03 AM

## 2016-08-23 NOTE — Progress Notes (Signed)
Discharge order received. Patient is alert and oriented. Vital signs stable . No signs of acute distress. Discharge instructions given. Patient has O2 tank in car for transport per patient. Patient verbalized understanding. No other issues noted at this time.

## 2016-08-23 NOTE — Care Management Important Message (Signed)
Important Message  Patient Details  Name: Carmie EndLinda Adan MRN: [id number] Date of Birth: 01/02/1956   Medicare Important Message Given:  N/A - LOS <3 / Initial given by admissions    Chapman FitchBOWEN, Havyn Ramo T, RN 08/23/2016, 3:45 PM

## 2016-08-23 NOTE — Discharge Summary (Signed)
[facility] - [facility] -  at [facility]   PATIENT NAME: Valerie Norris    MR#:  [id number]  DATE OF BIRTH:  1956-01-23  DATE OF ADMISSION:  08/21/2016 ADMITTING PHYSICIAN: Oralia Manis, MD  DATE OF DISCHARGE: No discharge date for patient encounter.  PRIMARY CARE PHYSICIAN: System, Pcp Not In     ADMISSION DIAGNOSIS:  COPD exacerbation (HCC) [J44.1] Community acquired pneumonia of right lower lobe of lung (HCC) [J18.1]  DISCHARGE DIAGNOSIS:  Principal Problem:   CAP (community acquired pneumonia) Active Problems:   COPD with acute exacerbation (HCC)   HTN (hypertension)   GERD (gastroesophageal reflux disease)   Malnutrition of moderate degree   Acute on chronic respiratory failure with hypoxia (HCC)   Right lower lobe pneumonia (HCC)   Pleuritic chest pain   Hyponatremia   Hypokalemia   Leukocytosis   Diabetes mellitus (HCC)   Arrhythmia   Hypomagnesemia   SECONDARY DIAGNOSIS:   Past Medical History:  Diagnosis Date  . Arthritis   . Asthma   . COPD (chronic obstructive pulmonary disease) (HCC)   . GERD (gastroesophageal reflux disease)   . Hypertension     .pro HOSPITAL COURSE:   Patient is 61 year old Caucasian female with past medical history significant for history of asthma, COPD, hypertension, gastroesophageal reflux disease, arthritis, who presented to the hospital with complaints of shortness of breath for 3 days, wheezing, cough, pain with respirations in bilateral lower lungs, mostly on the right. The patient was tachypneic, tachycardic, hypoxic on arrival to the hospital requiring 4 L of oxygen. Her chest x-ray revealed emphysema, right basilar opacity, Concerning for pneumonia. The patient was initiated on steroids, inhalation therapy, advanced, oxygen rate, antibiotics and clinically improved. She was noted to have PVCs, arrhythmia on telemetry, her magnesium level was checked and it was found to be low, supplement  intravenously. She was ambulated and did well with no significant shortness of breath. She is being discharged home today on her usual oxygen requirement with good oxygen saturations of 98-99% . The patient underwent right upper quadrant ultrasound due to pleuritic chest pain, revealing cholelithiasis, but otherwise no other abnormalities, it was felt to be pneumonia related pain. It subsided to his initiation of therapy. Discussion by problem: #1 right lower lobe pneumonia, improved clinically on Rocephin and Zithromax, continue levofloxacin for 5 more days to complete course, sputum culture is pending, please refer to sputum culture results and adjust medications if needed  #2. Acute respiratory failure with hypoxia due to COPD exacerbation due to pneumonia, improved clinically, patient was advised to continue nebulizing therapy every 4 hours, taper steroids, continue Dulera, Spiriva, oxygen , follow-up with primary care physician for further recommendations #3. Pleuritic chest pain, more right-sided ,  no acute cholecystitis noted on ultrasound, but cholelithiasis, no choledocholithiasis,, pain was  felt to be related to right lower lobe pneumonia, improved with  therapy. Liver enzymes were normal  #4. Hyponatremia, likely due to hyperglycemia,the patient was initiated on  IV fluids,  , Sodium level remained low at 134, it is recommended to follow it as outpatient  #5. Hyperglycemia due to steroids, tapering steroids. Hemoglobin A1c 5.4, no diabetes  #6. Leukocytosis, resolved  #7. Anemia,. Stable with hydration, Hemoccult was not obtained  DISCHARGE CONDITIONS:  Stable    DRUG ALLERGIES:   Allergies  Allergen Reactions  . Methadone     Other reaction(s): Unknown  . Tetracyclines & Related Nausea And Vomiting  . Latex Rash    DISCHARGE  MEDICATIONS:   Current Discharge Medication List    START taking these medications   Details  feeding supplement, GLUCERNA SHAKE, (GLUCERNA SHAKE) LIQD  Take 237 mLs by mouth 3 (three) times daily between meals. Qty: 90 Can, Refills: 0    guaiFENesin-dextromethorphan (ROBITUSSIN DM) 100-10 MG/5ML syrup Take 5 mLs by mouth every 4 (four) hours as needed for cough. Qty: 118 mL, Refills: 0    ipratropium-albuterol (DUONEB) 0.5-2.5 (3) MG/3ML SOLN Take 3 mLs by nebulization every 4 (four) hours. Qty: 360 mL, Refills: 3    levofloxacin (LEVAQUIN) 750 MG tablet Take 1 tablet (750 mg total) by mouth daily. Qty: 5 tablet, Refills: 0    magnesium oxide (MAGOX 400) 400 (241.3 Mg) MG tablet Take 1 tablet (400 mg total) by mouth daily. Qty: 30 tablet, Refills: 1    predniSONE (STERAPRED UNI-PAK 21 TAB) 10 MG (21) TBPK tablet Please take 6 pills in the morning on the day 1, then taper by one pill daily until finished, thank you Qty: 21 tablet, Refills: 0      CONTINUE these medications which have NOT CHANGED   Details  albuterol (PROVENTIL HFA;VENTOLIN HFA) 108 (90 Base) MCG/ACT inhaler two puffs every 4-6 hours as needed    Cholecalciferol (VITAMIN D3) 2000 units capsule Take 2,000 Units by mouth daily.     DULERA 100-5 MCG/ACT AERO Inhale 2 puffs into the lungs 2 (two) times daily. Refills: 11    EVZIO 2 MG/0.4ML SOAJ Inject 1 Dose into the muscle once. Refills: 0    !! gabapentin (NEURONTIN) 600 MG tablet Take 600 mg by mouth as needed.     !! gabapentin (NEURONTIN) 800 MG tablet Two (2) times a day.     Omega-3 1000 MG CAPS 2 (two) times daily.    omeprazole (PRILOSEC) 40 MG capsule TK 1 C PO QD.    oxybutynin (DITROPAN) 5 MG tablet Take 5 mg by mouth 2 (two) times daily.     oxyCODONE (ROXICODONE) 15 MG immediate release tablet Take 1 tablet by mouth 4 (four) times daily. Refills: 0    promethazine (PHENERGAN) 25 MG tablet once daily.    ranitidine (ZANTAC) 300 MG tablet TK 1 T PO QHS    sertraline (ZOLOFT) 100 MG tablet Take 200 mg by mouth.    Teriparatide, Recombinant, (FORTEO) 600 MCG/2.4ML SOLN Inject 20 mcg into the  skin.     theophylline (THEODUR) 300 MG 12 hr tablet Take 300 mg by mouth daily.     Tiotropium Bromide Monohydrate 2.5 MCG/ACT AERS Inhale 1 capsule into the lungs daily.     traZODone (DESYREL) 50 MG tablet TK 1 T PO QHS     !! - Potential duplicate medications found. Please discuss with provider.       DISCHARGE INSTRUCTIONS:    The patient is to follow-up with primary care physician within one week after discharge    you experience worsening of your admission symptoms, develop shortness of breath, life threatening emergency, suicidal or homicidal thoughts you must seek medical attention immediately by calling 911 or calling your MD immediately  if symptoms less severe.  You Must read complete instructions/literature along with all the possible adverse reactions/side effects for all the Medicines you take and that have been prescribed to you. Take any new Medicines after you have completely understood and accept all the possible adverse reactions/side effects.   Please note  You were cared for by a hospitalist during your hospital stay. If you have any  questions about your discharge medications or the care you received while you were in the hospital after you are discharged, you can call the unit and asked to speak with the hospitalist on call if the hospitalist that took care of you is not available. Once you are discharged, your primary care physician will handle any further medical issues. Please note that NO REFILLS for any discharge medications will be authorized once you are discharged, as it is imperative that you return to your primary care physician (or establish a relationship with a primary care physician if you do not have one) for your aftercare needs so that they can reassess your need for medications and monitor your lab values.    Today   CHIEF COMPLAINT:   Chief Complaint  Patient presents with  . Shortness of Breath    HISTORY OF PRESENT ILLNESS:      VITAL SIGNS:  Blood pressure 117/82, pulse 70, temperature 98 F (36.7 C), temperature source Oral, resp. rate 16, height 5\' 4"  (1.626 m), weight 53.1 kg (117 lb), SpO2 98 %.  I/O:   Intake/Output Summary (Last 24 hours) at 08/23/16 1504 Last data filed at 08/23/16 1300  Gross per 24 hour  Intake          1768.75 ml  Output              500 ml  Net          1268.75 ml    PHYSICAL EXAMINATION:  GENERAL:  61 y.o.-year-old patient lying in the bed with no acute distress.  EYES: Pupils equal, round, reactive to light and accommodation. No scleral icterus. Extraocular muscles intact.  HEENT: Head atraumatic, normocephalic. Oropharynx and nasopharynx clear.  NECK:  Supple, no jugular venous distention. No thyroid enlargement, no tenderness.  LUNGS: Normal breath sounds bilaterally, no wheezing, rales,rhonchi or crepitation. No use of accessory muscles of respiration.  CARDIOVASCULAR: S1, S2 normal. No murmurs, rubs, or gallops.  ABDOMEN: Soft, non-tender, non-distended. Bowel sounds present. No organomegaly or mass.  EXTREMITIES: No pedal edema, cyanosis, or clubbing.  NEUROLOGIC: Cranial nerves II through XII are intact. Muscle strength 5/5 in all extremities. Sensation intact. Gait not checked.  PSYCHIATRIC: The patient is alert and oriented x 3.  SKIN: No obvious rash, lesion, or ulcer.   DATA REVIEW:   CBC  Recent Labs Lab 08/22/16 0539 08/23/16 0540  WBC 7.3  --   HGB 9.9* 9.1*  HCT 28.7*  --   PLT 201  --     Chemistries   Recent Labs Lab 08/22/16 0539 08/23/16 0540  NA 134* 134*  K 3.5 4.1  CL 94* 97*  CO2 33* 30  GLUCOSE 347* 227*  BUN 8 11  CREATININE 0.53 0.52  CALCIUM 8.9 8.6*  MG  --  1.6*  AST 14*  --   ALT 9*  --   ALKPHOS 69  --   BILITOT 0.3  --     Cardiac Enzymes  Recent Labs Lab 08/21/16 2059  TROPONINI <0.03    Microbiology Results  Results for orders placed or performed during the hospital encounter of 08/21/16  Culture,  expectorated sputum-assessment     Status: None   Collection Time: 08/22/16 12:54 PM  Result Value Ref Range Status   Specimen Description EXPECTORATED SPUTUM  Final   Special Requests EXPECTORATED SPUTUM  Final   Sputum evaluation THIS SPECIMEN IS ACCEPTABLE FOR SPUTUM CULTURE  Final   Report Status 08/22/2016 FINAL  Final  Culture, respiratory (NON-Expectorated)     Status: None (Preliminary result)   Collection Time: 08/22/16 12:54 PM  Result Value Ref Range Status   Specimen Description EXPECTORATED SPUTUM  Final   Special Requests EXPECTORATED SPUTUM Reflexed from [id number]  Final   Gram Stain   Final    ABUNDANT WBC PRESENT, PREDOMINANTLY PMN NO ORGANISMS SEEN    Culture   Final    TOO YOUNG TO READ Performed at [facility], [address] [place], [place] [address]    Report Status PENDING  Incomplete    RADIOLOGY:  Dg Chest 1 View  Result Date: 08/21/2016 CLINICAL DATA:  Shortness of breath EXAM: CHEST 1 VIEW COMPARISON:  CT 05/30/2005 FINDINGS: Hyperinflation with emphysematous disease. Mild opacity at the right lateral lung base may reflect inflammatory infiltrate. No effusion. Normal heart size. No pneumothorax. IMPRESSION: Hyperinflation with emphysematous disease. Mild right lateral basilar opacity may reflect pulmonary infiltrate. Electronically Signed   By: Jasmine Pang M.D.   On: 08/21/2016 22:01   US Abdomen Limited Ruq  Result Date: 08/22/2016 CLINICAL DATA:  Right upper quadrant abdominal pain EXAM: US ABDOMEN LIMITED - RIGHT UPPER QUADRANT COMPARISON:  None. FINDINGS: Gallbladder: Multiple shadowing stones in the gallbladder, measuring up to 1.1 cm. Normal wall thickness. Negative sonographic Murphy. Common bile duct: Diameter: 3 mm Liver: No focal lesion identified. Within normal limits in parenchymal echogenicity. IMPRESSION: Cholelithiasis without sonographic evidence for acute cholecystitis. No biliary dilatation. Electronically Signed   By: Jasmine Pang  M.D.   On: 08/22/2016 15:01    EKG:  No orders found for this or any previous visit.    Management plans discussed with the patient, family and they are in agreement.  CODE STATUS:     Code Status Orders        Start     Ordered   08/22/16 0121  Full code  Continuous     08/22/16 0120    Code Status History    Date Active Date Inactive Code Status Order ID Comments User Context   This patient has a current code status but no historical code status.      TOTAL TIME TAKING CARE OF THIS PATIENT:  40  minutes.    Katharina Caper M.D on 08/23/2016 at 3:04 PM  Between 7am to 6pm - Pager - [phone]  After 6pm go to [email or web address] - password [email or web address]  [facility]  Office  [phone]  CC: Primary care physician; System, Pcp Not In

## 2016-08-23 NOTE — Care Management (Signed)
Patient admitted with CAP. Patient weaned to chronic home O2 setting of 3 liters. Patient has ambulated with nursing staff.  No RNCM needs identified.

## 2016-08-25 LAB — CULTURE, RESPIRATORY W GRAM STAIN: Culture: NORMAL

## 2016-08-25 LAB — CULTURE, RESPIRATORY

## 2017-04-23 ENCOUNTER — Inpatient Hospital Stay
Admission: EM | Admit: 2017-04-23 | Discharge: 2017-04-25 | DRG: 190 | Disposition: A | Discharge status: Home / Self Care | Payer: Medicare Other | Attending: Internal Medicine | Admitting: Internal Medicine

## 2017-04-23 ENCOUNTER — Emergency Department: Payer: Medicare Other

## 2017-04-23 ENCOUNTER — Encounter: Payer: Self-pay | Admitting: *Deleted

## 2017-04-23 ENCOUNTER — Other Ambulatory Visit: Payer: Self-pay

## 2017-04-23 DIAGNOSIS — J9622 Acute and chronic respiratory failure with hypercapnia: Secondary | ICD-10-CM | POA: Diagnosis present

## 2017-04-23 DIAGNOSIS — G8929 Other chronic pain: Secondary | ICD-10-CM | POA: Diagnosis present

## 2017-04-23 DIAGNOSIS — K219 Gastro-esophageal reflux disease without esophagitis: Secondary | ICD-10-CM | POA: Diagnosis present

## 2017-04-23 DIAGNOSIS — Z825 Family history of asthma and other chronic lower respiratory diseases: Secondary | ICD-10-CM

## 2017-04-23 DIAGNOSIS — Z87891 Personal history of nicotine dependence: Secondary | ICD-10-CM | POA: Diagnosis not present

## 2017-04-23 DIAGNOSIS — G9349 Other encephalopathy: Secondary | ICD-10-CM | POA: Diagnosis present

## 2017-04-23 DIAGNOSIS — J441 Chronic obstructive pulmonary disease with (acute) exacerbation: Secondary | ICD-10-CM | POA: Diagnosis present

## 2017-04-23 DIAGNOSIS — Z79891 Long term (current) use of opiate analgesic: Secondary | ICD-10-CM | POA: Diagnosis not present

## 2017-04-23 DIAGNOSIS — R0602 Shortness of breath: Secondary | ICD-10-CM | POA: Diagnosis present

## 2017-04-23 DIAGNOSIS — M1990 Unspecified osteoarthritis, unspecified site: Secondary | ICD-10-CM | POA: Diagnosis present

## 2017-04-23 DIAGNOSIS — Z91040 Latex allergy status: Secondary | ICD-10-CM

## 2017-04-23 DIAGNOSIS — Z885 Allergy status to narcotic agent status: Secondary | ICD-10-CM

## 2017-04-23 DIAGNOSIS — Z79899 Other long term (current) drug therapy: Secondary | ICD-10-CM | POA: Diagnosis not present

## 2017-04-23 DIAGNOSIS — J9600 Acute respiratory failure, unspecified whether with hypoxia or hypercapnia: Secondary | ICD-10-CM | POA: Diagnosis present

## 2017-04-23 DIAGNOSIS — G629 Polyneuropathy, unspecified: Secondary | ICD-10-CM | POA: Diagnosis present

## 2017-04-23 DIAGNOSIS — Z9981 Dependence on supplemental oxygen: Secondary | ICD-10-CM | POA: Diagnosis not present

## 2017-04-23 DIAGNOSIS — Z881 Allergy status to other antibiotic agents status: Secondary | ICD-10-CM

## 2017-04-23 DIAGNOSIS — I10 Essential (primary) hypertension: Secondary | ICD-10-CM | POA: Diagnosis present

## 2017-04-23 DIAGNOSIS — G9340 Encephalopathy, unspecified: Secondary | ICD-10-CM

## 2017-04-23 DIAGNOSIS — R739 Hyperglycemia, unspecified: Secondary | ICD-10-CM | POA: Diagnosis present

## 2017-04-23 DIAGNOSIS — T380X5A Adverse effect of glucocorticoids and synthetic analogues, initial encounter: Secondary | ICD-10-CM | POA: Diagnosis present

## 2017-04-23 DIAGNOSIS — J9621 Acute and chronic respiratory failure with hypoxia: Secondary | ICD-10-CM

## 2017-04-23 LAB — CBC WITH DIFFERENTIAL/PLATELET
BASOS PCT: 0 %
Basophils Absolute: 0 10*3/uL (ref 0–0.1)
EOS ABS: 0.1 10*3/uL (ref 0–0.7)
EOS PCT: 1 %
HCT: 35 % (ref 35.0–47.0)
HEMOGLOBIN: 12 g/dL (ref 12.0–16.0)
Lymphocytes Relative: 8 %
Lymphs Abs: 0.7 10*3/uL — ABNORMAL LOW (ref 1.0–3.6)
MCH: 29.2 pg (ref 26.0–34.0)
MCHC: 34.2 g/dL (ref 32.0–36.0)
MCV: 85.4 fL (ref 80.0–100.0)
MONOS PCT: 5 %
Monocytes Absolute: 0.4 10*3/uL (ref 0.2–0.9)
NEUTROS PCT: 86 %
Neutro Abs: 7.6 10*3/uL — ABNORMAL HIGH (ref 1.4–6.5)
Platelets: 157 10*3/uL (ref 150–440)
RBC: 4.1 MIL/uL (ref 3.80–5.20)
RDW: 13.3 % (ref 11.5–14.5)
WBC: 8.8 10*3/uL (ref 3.6–11.0)

## 2017-04-23 LAB — COMPREHENSIVE METABOLIC PANEL
ALK PHOS: 51 U/L (ref 38–126)
ALT: 10 U/L — ABNORMAL LOW (ref 14–54)
ANION GAP: 10 (ref 5–15)
AST: 22 U/L (ref 15–41)
Albumin: 4.1 g/dL (ref 3.5–5.0)
BILIRUBIN TOTAL: 1 mg/dL (ref 0.3–1.2)
BUN: 11 mg/dL (ref 6–20)
CALCIUM: 9.1 mg/dL (ref 8.9–10.3)
CO2: 34 mmol/L — ABNORMAL HIGH (ref 22–32)
Chloride: 95 mmol/L — ABNORMAL LOW (ref 101–111)
Creatinine, Ser: 0.56 mg/dL (ref 0.44–1.00)
GFR calc non Af Amer: >60 mL/min (ref >60)
GLUCOSE: 149 mg/dL — AB (ref 65–99)
Potassium: 3.9 mmol/L (ref 3.5–5.1)
Sodium: 139 mmol/L (ref 135–145)
TOTAL PROTEIN: 6.8 g/dL (ref 6.5–8.1)

## 2017-04-23 LAB — URINE DRUG SCREEN, QUALITATIVE (ARMC ONLY)
Amphetamines, Ur Screen: NOT DETECTED
BARBITURATES, UR SCREEN: NOT DETECTED
Benzodiazepine, Ur Scrn: NOT DETECTED
CANNABINOID 50 NG, UR ~~LOC~~: NOT DETECTED
COCAINE METABOLITE, UR ~~LOC~~: NOT DETECTED
MDMA (ECSTASY) UR SCREEN: NOT DETECTED
Methadone Scn, Ur: NOT DETECTED
OPIATE, UR SCREEN: NOT DETECTED
Phencyclidine (PCP) Ur S: NOT DETECTED
Tricyclic, Ur Screen: NOT DETECTED

## 2017-04-23 LAB — URINALYSIS, COMPLETE (UACMP) WITH MICROSCOPIC
Bilirubin Urine: NEGATIVE
Glucose, UA: NEGATIVE mg/dL
Ketones, ur: NEGATIVE mg/dL
Nitrite: NEGATIVE
PROTEIN: NEGATIVE mg/dL
SPECIFIC GRAVITY, URINE: 1.006 (ref 1.005–1.030)
pH: 5 (ref 5.0–8.0)

## 2017-04-23 LAB — BLOOD GAS, VENOUS
Acid-Base Excess: 12 mmol/L — ABNORMAL HIGH (ref 0.0–2.0)
Bicarbonate: 39 mmol/L — ABNORMAL HIGH (ref 20.0–28.0)
O2 SAT: 93.5 %
PATIENT TEMPERATURE: 37
PCO2 VEN: 63 mmHg — AB (ref 44.0–60.0)
PO2 VEN: 69 mmHg — AB (ref 32.0–45.0)
pH, Ven: 7.4 (ref 7.250–7.430)

## 2017-04-23 LAB — TROPONIN I: Troponin I: <0.03 ng/mL (ref <0.03)

## 2017-04-23 LAB — MAGNESIUM: Magnesium: 1.7 mg/dL (ref 1.7–2.4)

## 2017-04-23 LAB — BRAIN NATRIURETIC PEPTIDE: B NATRIURETIC PEPTIDE 5: 27 pg/mL (ref 0.0–100.0)

## 2017-04-23 LAB — LACTIC ACID, PLASMA: Lactic Acid, Venous: 1.6 mmol/L (ref 0.5–1.9)

## 2017-04-23 MED ORDER — IPRATROPIUM-ALBUTEROL 0.5-2.5 (3) MG/3ML IN SOLN
3.0000 mL | Freq: Once | RESPIRATORY_TRACT | Status: AC
  Administered 2017-04-23: 3 mL via RESPIRATORY_TRACT

## 2017-04-23 MED ORDER — ONDANSETRON HCL 4 MG/2ML IJ SOLN
INTRAMUSCULAR | Status: AC
  Filled 2017-04-23: qty 2

## 2017-04-23 MED ORDER — IPRATROPIUM-ALBUTEROL 0.5-2.5 (3) MG/3ML IN SOLN
RESPIRATORY_TRACT | Status: AC
  Filled 2017-04-23: qty 3

## 2017-04-23 MED ORDER — IPRATROPIUM-ALBUTEROL 0.5-2.5 (3) MG/3ML IN SOLN
3.0000 mL | Freq: Once | RESPIRATORY_TRACT | Status: AC
  Administered 2017-04-23: 3 mL via RESPIRATORY_TRACT
  Filled 2017-04-23: qty 6

## 2017-04-23 MED ORDER — FUROSEMIDE 10 MG/ML IJ SOLN
40.0000 mg | Freq: Once | INTRAMUSCULAR | Status: AC
  Administered 2017-04-23: 40 mg via INTRAVENOUS
  Filled 2017-04-23: qty 4

## 2017-04-23 MED ORDER — ALBUTEROL SULFATE (2.5 MG/3ML) 0.083% IN NEBU
2.5000 mg | INHALATION_SOLUTION | Freq: Once | RESPIRATORY_TRACT | Status: AC
  Administered 2017-04-23: 2.5 mg via RESPIRATORY_TRACT
  Filled 2017-04-23: qty 3

## 2017-04-23 MED ORDER — METHYLPREDNISOLONE SODIUM SUCC 125 MG IJ SOLR
80.0000 mg | Freq: Once | INTRAMUSCULAR | Status: AC
  Administered 2017-04-23: 80 mg via INTRAVENOUS
  Filled 2017-04-23: qty 2

## 2017-04-23 NOTE — ED Provider Notes (Signed)
[facility] ([facility]) Emergency Department Provider Note    First MD Initiated Contact with Patient 04/23/17 1854     (approximate)  I have reviewed the triage vital signs and the nursing notes.   HISTORY  Chief Complaint Shortness of Breath    HPI Valerie Norris is a 62 y.o. female presents from home via EMS for evaluation of confusion and shortness of breath.  Patient is home O2 for history of COPD.  Patient was actually being seen by orthopedic physician in office this morning for chronic neuropathy and leg pain.  States that she is on oxycodone at home.  Did take 1 of those upon leaving the orthopedic clinic office.  On the way home patient started acting more confused and not making any sense.  Denies any no numbness or tingling no weakness.  Patient does endorse shortness of breath but no chest pain.  No lower extremity swelling.  Husband states that she has been having intermittent headaches for the past several weeks and has a family history of malignancy.  Past Medical History:  Diagnosis Date  . Arthritis   . Asthma   . COPD (chronic obstructive pulmonary disease) (HCC)   . GERD (gastroesophageal reflux disease)   . Hypertension    Family History  Problem Relation Age of Onset  . Heart attack Mother   . COPD Father   . Heart failure Brother    Past Surgical History:  Procedure Laterality Date  . BACK SURGERY     Patient Active Problem List   Diagnosis Date Noted  . Acute on chronic respiratory failure with hypoxia (HCC) 08/23/2016  . Right lower lobe pneumonia (HCC) 08/23/2016  . Pleuritic chest pain 08/23/2016  . Hyponatremia 08/23/2016  . Hypokalemia 08/23/2016  . Leukocytosis 08/23/2016  . Diabetes mellitus (HCC) 08/23/2016  . Arrhythmia 08/23/2016  . Hypomagnesemia 08/23/2016  . Malnutrition of moderate degree 08/22/2016  . CAP (community acquired pneumonia) 08/21/2016  . COPD with acute exacerbation (HCC) 08/21/2016  . HTN  (hypertension) 08/21/2016  . GERD (gastroesophageal reflux disease) 08/21/2016      Prior to Admission medications   Medication Sig Start Date End Date Taking? Authorizing Provider  albuterol (PROVENTIL HFA;VENTOLIN HFA) 108 (90 Base) MCG/ACT inhaler two puffs every 4-6 hours as needed    [provider]  Cholecalciferol (VITAMIN D3) 2000 units capsule Take 2,000 Units by mouth daily.  03/08/16   [provider]  DULERA 100-5 MCG/ACT AERO Inhale 2 puffs into the lungs 2 (two) times daily. 07/01/16   [provider]  EVZIO 2 MG/0.4ML SOAJ Inject 1 Dose into the muscle once. 05/23/16   [provider]  feeding supplement, GLUCERNA SHAKE, (GLUCERNA SHAKE) LIQD Take 237 mLs by mouth 3 (three) times daily between meals. 08/23/16   Katharina Caper, MD  gabapentin (NEURONTIN) 600 MG tablet Take 600 mg by mouth as needed.  04/02/16   [provider]  gabapentin (NEURONTIN) 800 MG tablet Two (2) times a day.  09/03/13   [provider]  guaiFENesin-dextromethorphan (ROBITUSSIN DM) 100-10 MG/5ML syrup Take 5 mLs by mouth every 4 (four) hours as needed for cough. 08/23/16   Katharina Caper, MD  ipratropium-albuterol (DUONEB) 0.5-2.5 (3) MG/3ML SOLN Take 3 mLs by nebulization every 4 (four) hours. 08/23/16   Katharina Caper, MD  magnesium oxide (MAGOX 400) 400 (241.3 Mg) MG tablet Take 1 tablet (400 mg total) by mouth daily. 08/23/16   Katharina Caper, MD  Omega-3 1000 MG CAPS 2 (two)  times daily.    [provider]  omeprazole (PRILOSEC) 40 MG capsule TK 1 C PO QD. 04/02/16   [provider]  oxybutynin (DITROPAN) 5 MG tablet Take 5 mg by mouth 2 (two) times daily.  02/26/16   [provider]  oxyCODONE (ROXICODONE) 15 MG immediate release tablet Take 1 tablet by mouth 4 (four) times daily. 07/30/16   [provider]  predniSONE (STERAPRED UNI-PAK 21 TAB) 10 MG (21) TBPK tablet Please take 6 pills in the morning on the day 1,  then taper by one pill daily until finished, thank you 08/23/16   Katharina Caper, MD  promethazine (PHENERGAN) 25 MG tablet once daily.    [provider]  ranitidine (ZANTAC) 300 MG tablet TK 1 T PO QHS 02/26/16   [provider]  sertraline (ZOLOFT) 100 MG tablet Take 200 mg by mouth. 07/01/16 07/01/17  [provider]  Teriparatide, Recombinant, (FORTEO) 600 MCG/2.4ML SOLN Inject 20 mcg into the skin.  04/20/16   [provider]  theophylline (THEODUR) 300 MG 12 hr tablet Take 300 mg by mouth daily.  03/26/16 03/26/17  [provider]  Tiotropium Bromide Monohydrate 2.5 MCG/ACT AERS Inhale 1 capsule into the lungs daily.  03/12/16   [provider]  traZODone (DESYREL) 50 MG tablet TK 1 T PO QHS 03/22/16   [provider]    Allergies Methadone; Tetracyclines & related; and Latex    Social History Social History   Tobacco Use  . Smoking status: Former Games developer  . Smokeless tobacco: Never Used  Substance Use Topics  . Alcohol use: No  . Drug use: No    Review of Systems Patient denies headaches, rhinorrhea, blurry vision, numbness, shortness of breath, chest pain, edema, cough, abdominal pain, nausea, vomiting, diarrhea, dysuria, fevers, rashes or hallucinations unless otherwise stated above in HPI. ____________________________________________   PHYSICAL EXAM:  VITAL SIGNS: Vitals:   04/23/17 1909 04/23/17 1914  BP:  120/74  Pulse:  83  Temp:  97.7 F (36.5 C)  SpO2: 96% 100%    Constitutional: Alert and oriented. Drowsy but conversant Eyes: Conjunctivae are normal.  Head: Atraumatic. Nose: No congestion/rhinnorhea. Mouth/Throat: Mucous membranes are moist.   Neck: No stridor. Painless ROM.  Cardiovascular: Normal rate, regular rhythm. Grossly normal heart sounds.  Good peripheral circulation. Respiratory: tachypnea with expiratory wheeze throughout, diminished in the bases Gastrointestinal: Soft and  nontender. No distention. No abdominal bruits. No CVA tenderness. Musculoskeletal: No lower extremity tenderness nor edema.  No joint effusions. Neurologic:  Normal speech and language. No gross focal neurologic deficits are appreciated. No facial droop Skin:  Skin is warm, dry and intact. No rash noted. Psychiatric: Mood and affect are normal. Speech and behavior are normal.  ____________________________________________   LABS (all labs ordered are listed, but only abnormal results are displayed)  Results for orders placed or performed during the hospital encounter of 04/23/17 (from the past 24 hour(s))  Lactic acid, plasma     Status: None   Collection Time: 04/23/17  7:09 PM  Result Value Ref Range   Lactic Acid, Venous 1.6 0.5 - 1.9 mmol/L  Comprehensive metabolic panel     Status: Abnormal   Collection Time: 04/23/17  7:09 PM  Result Value Ref Range   Sodium 139 135 - 145 mmol/L   Potassium 3.9 3.5 - 5.1 mmol/L   Chloride 95 (L) 101 - 111 mmol/L   CO2 34 (H) 22 - 32 mmol/L   Glucose, Bld 149 (  H) 65 - 99 mg/dL   BUN 11 6 - 20 mg/dL   Creatinine, Ser 6.96 0.44 - 1.00 mg/dL   Calcium 9.1 8.9 - 29.5 mg/dL   Total Protein 6.8 6.5 - 8.1 g/dL   Albumin 4.1 3.5 - 5.0 g/dL   AST 22 15 - 41 U/L   ALT 10 (L) 14 - 54 U/L   Alkaline Phosphatase 51 38 - 126 U/L   Total Bilirubin 1.0 0.3 - 1.2 mg/dL   GFR calc non Af Amer >60 >60 mL/min   GFR calc Af Amer >60 >60 mL/min   Anion gap 10 5 - 15  Troponin I     Status: None   Collection Time: 04/23/17  7:09 PM  Result Value Ref Range   Troponin I <0.03 <0.03 ng/mL  CBC WITH DIFFERENTIAL     Status: Abnormal   Collection Time: 04/23/17  7:09 PM  Result Value Ref Range   WBC 8.8 3.6 - 11.0 K/uL   RBC 4.10 3.80 - 5.20 MIL/uL   Hemoglobin 12.0 12.0 - 16.0 g/dL   HCT 28.4 13.2 - 44.0 %   MCV 85.4 80.0 - 100.0 fL   MCH 29.2 26.0 - 34.0 pg   MCHC 34.2 32.0 - 36.0 g/dL   RDW 10.2 72.5 - 36.6 %   Platelets 157 150 - 440 K/uL    Neutrophils Relative % 86 %   Neutro Abs 7.6 (H) 1.4 - 6.5 K/uL   Lymphocytes Relative 8 %   Lymphs Abs 0.7 (L) 1.0 - 3.6 K/uL   Monocytes Relative 5 %   Monocytes Absolute 0.4 0.2 - 0.9 K/uL   Eosinophils Relative 1 %   Eosinophils Absolute 0.1 0 - 0.7 K/uL   Basophils Relative 0 %   Basophils Absolute 0.0 0 - 0.1 K/uL  Brain natriuretic peptide     Status: None   Collection Time: 04/23/17  7:09 PM  Result Value Ref Range   B Natriuretic Peptide 27.0 0.0 - 100.0 pg/mL  Urinalysis, Complete w Microscopic     Status: Abnormal   Collection Time: 04/23/17  7:09 PM  Result Value Ref Range   Color, Urine YELLOW (A) YELLOW   APPearance CLEAR (A) CLEAR   Specific Gravity, Urine 1.006 1.005 - 1.030   pH 5.0 5.0 - 8.0   Glucose, UA NEGATIVE NEGATIVE mg/dL   Hgb urine dipstick SMALL (A) NEGATIVE   Bilirubin Urine NEGATIVE NEGATIVE   Ketones, ur NEGATIVE NEGATIVE mg/dL   Protein, ur NEGATIVE NEGATIVE mg/dL   Nitrite NEGATIVE NEGATIVE   Leukocytes, UA TRACE (A) NEGATIVE   RBC / HPF 0-5 0 - 5 RBC/hpf   WBC, UA 0-5 0 - 5 WBC/hpf   Bacteria, UA RARE (A) NONE SEEN   Squamous Epithelial / LPF 0-5 (A) NONE SEEN  Magnesium     Status: None   Collection Time: 04/23/17  7:09 PM  Result Value Ref Range   Magnesium 1.7 1.7 - 2.4 mg/dL  Blood gas, venous     Status: Abnormal   Collection Time: 04/23/17  9:06 PM  Result Value Ref Range   pH, Ven 7.40 7.250 - 7.430   pCO2, Ven 63 (H) 44.0 - 60.0 mmHg   pO2, Ven 69.0 (H) 32.0 - 45.0 mmHg   Bicarbonate 39.0 (H) 20.0 - 28.0 mmol/L   Acid-Base Excess 12.0 (H) 0.0 - 2.0 mmol/L   O2 Saturation 93.5 %   Patient temperature 37.0    Collection site VENOUS  Sample type VENOUS    ____________________________________________  EKG My review and personal interpretation at Time: 19:08   Indication: ams  Rate: 80  Rhythm: sinus Axis: normal Other: no stemi, normal intervals, non specific  changes ____________________________________________  RADIOLOGY  I personally reviewed all radiographic images ordered to evaluate for the above acute complaints and reviewed radiology reports and findings.  These findings were personally discussed with the patient.  Please see medical record for radiology report.  ____________________________________________   PROCEDURES  Procedure(s) performed:  .Critical Care Performed by: Willy Eddyobinson, Kristian Mogg, MD Authorized by: Willy Eddyobinson, Jerrel Tiberio, MD   Critical care provider statement:    Critical care time (minutes):  30   Critical care time was exclusive of:  Separately billable procedures and treating other patients   Critical care was necessary to treat or prevent imminent or life-threatening deterioration of the following conditions:  Respiratory failure   Critical care was time spent personally by me on the following activities:  Development of treatment plan with patient or surrogate, discussions with consultants, evaluation of patient's response to treatment, examination of patient, obtaining history from patient or surrogate, ordering and performing treatments and interventions, ordering and review of laboratory studies, ordering and review of radiographic studies, pulse oximetry, re-evaluation of patient's condition and review of old charts      Critical Care performed: yes ____________________________________________   INITIAL IMPRESSION / ASSESSMENT AND PLAN / ED COURSE  Pertinent labs & imaging results that were available during my care of the patient were reviewed by me and considered in my medical decision making (see chart for details).  DDX: Asthma, copd, CHF, pna, ptx, malignancy, Pe, anemia   Valerie Norris is a 62 y.o. who presents to the ED with his as described above.  Very bizarre presentation.  Does not seem to be in any acute respiratory distress but is very drowsy and I do suspect component of hypercapnia.  Also  reported to have taken pain medication but her pupils are normal and reactive.  No focal neuro deficits to suggest stroke but given her family history malignancy and headaches will order CT scan.  Chest x-ray will be ordered to evaluate for the above differential.  Will give nebulizer treatments and steroids. The patient will be placed on continuous pulse oximetry and telemetry for monitoring.  Laboratory evaluation will be sent to evaluate for the above complaints.     Clinical Course as of Apr 23 2140  Wed Apr 23, 2017  2138 Patient reassessed.  Still very drowsy.  VBG does show hypercapnia but not to the point where I would expect to explain all of her altered mental status.  I do suspect some component of pharmaceutical induced encephalopathy.  Patient is feeling shortness of breath and does have diminished breath sounds as compared to earlier after being assessed after the nebulizers.  Patient was reportedly recommended to have some form of CPAP or BiPAP by pulmonology therefore will order this and reassess for improvement.  Patient given IV magnesium for low magnesium.  No evidence of ACS.  Abdominal exam is soft and benign.  Have discussed with the patient and available family all diagnostics and treatments performed thus far and all questions were answered to the best of my ability. The patient demonstrates understanding and agreement with plan.   [PR]    Clinical Course User Index [PR] Willy Eddyobinson, Jakai Onofre, MD     ____________________________________________   FINAL CLINICAL IMPRESSION(S) / ED DIAGNOSES  Final diagnoses:  COPD exacerbation (HCC)  Acute  on chronic respiratory failure with hypoxia and hypercapnia (HCC)  Acute encephalopathy      NEW MEDICATIONS STARTED DURING THIS VISIT:  New Prescriptions   No medications on file     Note:  This document was prepared using Dragon voice recognition software and may include unintentional dictation errors.    Willy Eddy,  MD 04/23/17 2325

## 2017-04-23 NOTE — ED Notes (Signed)
Per Dr Salley HewsMaeir, pt can be taken off Bi-Pap to assess for need. She states there are no/limited Step-Down Unit beds available and would be willing to order CPAP for pt if needed to allow for admission to a regular medical unit. Informed Kristie, RT, who stated Bi-Pap mask could be removed and machine placed on "stand-by" mode while assessing pt's status without it while being placed on O2 via [place].

## 2017-04-23 NOTE — ED Triage Notes (Signed)
Pt to ED from home reporting increased SOB and cough for the past 2 days. Husband reportedly told EMS that pt has also had intermittent AMS and "taking out of her head" Pt is alert and oriented x 4 upon arrival and does not appear to have increased WOB. Pt is on 4L [place] to maintain normal oxygen saturation per EMS when her chronic oxygen is 2L. Upon this RN assessment pt has an oxygen saturation of 100 on 4L and has been placed back on chronic 2L.   Pt was given 2 duo ned treatments with EMS that pt reports improved her SOB and EMS reports the breathing treatments decreased wheezing they had ausculted in bilateral upper lobes

## 2017-04-23 NOTE — ED Notes (Signed)
Pt appears to be more alert and is verbalizing that she has to use the bathroom. Pt up and ambulatory to restroom without assistance. Pt is answering questions appropriately. NAD at this time.

## 2017-04-23 NOTE — ED Notes (Signed)
Informed pt and family on delay with admission. Pt and her husband are both very understanding, and aware it might take another 30 minutes or longer before being able to fully assess pt's condition to determine proper admission placement.

## 2017-04-23 NOTE — ED Notes (Signed)
Pt in at bedside with patient and patient's family. Pt is sleeping and very hard to arouse. Sternal rub needed to wake pt and pt reports she feels very sleepy. Family reports pt does not usually sleep this heavily but has been sleeping more than normal recently.

## 2017-04-24 ENCOUNTER — Other Ambulatory Visit: Payer: Self-pay

## 2017-04-24 LAB — BASIC METABOLIC PANEL
ANION GAP: 8 (ref 5–15)
BUN: 16 mg/dL (ref 6–20)
CO2: 34 mmol/L — ABNORMAL HIGH (ref 22–32)
Calcium: 9 mg/dL (ref 8.9–10.3)
Chloride: 93 mmol/L — ABNORMAL LOW (ref 101–111)
Creatinine, Ser: 0.62 mg/dL (ref 0.44–1.00)
Glucose, Bld: 303 mg/dL — ABNORMAL HIGH (ref 65–99)
Potassium: 3.7 mmol/L (ref 3.5–5.1)
SODIUM: 135 mmol/L (ref 135–145)

## 2017-04-24 LAB — BLOOD CULTURE ID PANEL (REFLEXED)
ACINETOBACTER BAUMANNII: NOT DETECTED
CANDIDA ALBICANS: NOT DETECTED
CARBAPENEM RESISTANCE: NOT DETECTED
Candida glabrata: NOT DETECTED
Candida krusei: NOT DETECTED
Candida parapsilosis: NOT DETECTED
Candida tropicalis: NOT DETECTED
ENTEROBACTER CLOACAE COMPLEX: NOT DETECTED
ENTEROCOCCUS SPECIES: NOT DETECTED
Enterobacteriaceae species: NOT DETECTED
Escherichia coli: NOT DETECTED
Haemophilus influenzae: NOT DETECTED
Klebsiella oxytoca: NOT DETECTED
Klebsiella pneumoniae: NOT DETECTED
Listeria monocytogenes: NOT DETECTED
METHICILLIN RESISTANCE: NOT DETECTED
NEISSERIA MENINGITIDIS: NOT DETECTED
PSEUDOMONAS AERUGINOSA: NOT DETECTED
Proteus species: NOT DETECTED
STAPHYLOCOCCUS AUREUS BCID: NOT DETECTED
STAPHYLOCOCCUS SPECIES: NOT DETECTED
STREPTOCOCCUS PNEUMONIAE: NOT DETECTED
STREPTOCOCCUS SPECIES: DETECTED — AB
Serratia marcescens: NOT DETECTED
Streptococcus agalactiae: NOT DETECTED
Streptococcus pyogenes: NOT DETECTED
VANCOMYCIN RESISTANCE: NOT DETECTED

## 2017-04-24 LAB — GLUCOSE, CAPILLARY
Glucose-Capillary: 139 mg/dL — ABNORMAL HIGH (ref 65–99)
Glucose-Capillary: 204 mg/dL — ABNORMAL HIGH (ref 65–99)
Glucose-Capillary: 149 mg/dL — ABNORMAL HIGH (ref 65–99)
Glucose-Capillary: 171 mg/dL — ABNORMAL HIGH (ref 65–99)

## 2017-04-24 LAB — CBC
HEMATOCRIT: 32.6 % — AB (ref 35.0–47.0)
HEMOGLOBIN: 11.1 g/dL — AB (ref 12.0–16.0)
MCH: 29.1 pg (ref 26.0–34.0)
MCHC: 34.1 g/dL (ref 32.0–36.0)
MCV: 85.4 fL (ref 80.0–100.0)
Platelets: 142 10*3/uL — ABNORMAL LOW (ref 150–440)
RBC: 3.82 MIL/uL (ref 3.80–5.20)
RDW: 13.3 % (ref 11.5–14.5)
WBC: 6.3 10*3/uL (ref 3.6–11.0)

## 2017-04-24 LAB — HEMOGLOBIN A1C
HEMOGLOBIN A1C: 5.4 % (ref 4.8–5.6)
Mean Plasma Glucose: 108.28 mg/dL

## 2017-04-24 MED ORDER — BISACODYL 5 MG PO TBEC: 5.0000 mg | DELAYED_RELEASE_TABLET | Freq: Every day | ORAL | Status: DC | PRN

## 2017-04-24 MED ORDER — SODIUM CHLORIDE 0.9 % IV SOLN
Freq: Once | INTRAVENOUS | Status: AC
  Administered 2017-04-24: 01:00:00 via INTRAVENOUS

## 2017-04-24 MED ORDER — PANTOPRAZOLE SODIUM 40 MG PO TBEC
40.0000 mg | DELAYED_RELEASE_TABLET | Freq: Every day | ORAL | Status: DC
Start: 2017-04-24 — End: 2017-04-25
  Administered 2017-04-24 – 2017-04-25 (×2): 40 mg via ORAL
  Filled 2017-04-24 (×2): qty 1

## 2017-04-24 MED ORDER — THEOPHYLLINE ER 300 MG PO TB12
300.0000 mg | ORAL_TABLET | Freq: Every day | ORAL | Status: DC
  Administered 2017-04-24 – 2017-04-25 (×2): 300 mg via ORAL
  Filled 2017-04-24 (×2): qty 1

## 2017-04-24 MED ORDER — METHYLPREDNISOLONE SODIUM SUCC 125 MG IJ SOLR
60.0000 mg | Freq: Four times a day (QID) | INTRAMUSCULAR | Status: DC
  Administered 2017-04-24 (×2): 60 mg via INTRAVENOUS
  Filled 2017-04-24 (×2): qty 2

## 2017-04-24 MED ORDER — ONDANSETRON HCL 4 MG/2ML IJ SOLN: 4.0000 mg | Freq: Four times a day (QID) | INTRAMUSCULAR | Status: DC | PRN

## 2017-04-24 MED ORDER — OXYCODONE HCL 5 MG PO TABS: 15.0000 mg | ORAL_TABLET | Freq: Four times a day (QID) | ORAL | Status: DC

## 2017-04-24 MED ORDER — IPRATROPIUM-ALBUTEROL 0.5-2.5 (3) MG/3ML IN SOLN
3.0000 mL | RESPIRATORY_TRACT | Status: DC
  Administered 2017-04-24 (×3): 3 mL via RESPIRATORY_TRACT
  Filled 2017-04-24 (×3): qty 3

## 2017-04-24 MED ORDER — IPRATROPIUM-ALBUTEROL 0.5-2.5 (3) MG/3ML IN SOLN
3.0000 mL | Freq: Three times a day (TID) | RESPIRATORY_TRACT | Status: DC
  Administered 2017-04-24 – 2017-04-25 (×2): 3 mL via RESPIRATORY_TRACT
  Filled 2017-04-24 (×2): qty 3

## 2017-04-24 MED ORDER — ONDANSETRON HCL 4 MG PO TABS: 4.0000 mg | ORAL_TABLET | Freq: Four times a day (QID) | ORAL | Status: DC | PRN

## 2017-04-24 MED ORDER — OXYCODONE HCL 5 MG PO TABS
15.0000 mg | ORAL_TABLET | Freq: Four times a day (QID) | ORAL | Status: DC
  Administered 2017-04-24 – 2017-04-25 (×2): 15 mg via ORAL
  Filled 2017-04-24 (×2): qty 3

## 2017-04-24 MED ORDER — DOCUSATE SODIUM 100 MG PO CAPS
100.0000 mg | ORAL_CAPSULE | Freq: Two times a day (BID) | ORAL | Status: DC
  Administered 2017-04-24 – 2017-04-25 (×3): 100 mg via ORAL
  Filled 2017-04-24 (×3): qty 1

## 2017-04-24 MED ORDER — GUAIFENESIN-DM 100-10 MG/5ML PO SYRP: 5.0000 mL | ORAL_SOLUTION | ORAL | Status: DC | PRN

## 2017-04-24 MED ORDER — INSULIN ASPART 100 UNIT/ML ~~LOC~~ SOLN
0.0000 [IU] | Freq: Three times a day (TID) | SUBCUTANEOUS | Status: DC
  Administered 2017-04-24: 3 [IU] via SUBCUTANEOUS
  Administered 2017-04-24: 1 [IU] via SUBCUTANEOUS
  Filled 2017-04-24 (×2): qty 1

## 2017-04-24 MED ORDER — SERTRALINE HCL 50 MG PO TABS
200.0000 mg | ORAL_TABLET | Freq: Every day | ORAL | Status: DC
  Administered 2017-04-24 – 2017-04-25 (×2): 200 mg via ORAL
  Filled 2017-04-24 (×2): qty 4

## 2017-04-24 MED ORDER — MAGNESIUM OXIDE 400 (241.3 MG) MG PO TABS
400.0000 mg | ORAL_TABLET | Freq: Every day | ORAL | Status: DC
  Administered 2017-04-24 – 2017-04-25 (×2): 400 mg via ORAL
  Filled 2017-04-24 (×2): qty 1

## 2017-04-24 MED ORDER — HYDROCODONE-ACETAMINOPHEN 5-325 MG PO TABS
1.0000 | ORAL_TABLET | ORAL | Status: DC | PRN
  Administered 2017-04-25: 2 via ORAL
  Filled 2017-04-24: qty 2
  Filled 2017-04-24: qty 1

## 2017-04-24 MED ORDER — TIOTROPIUM BROMIDE MONOHYDRATE 18 MCG IN CAPS
18.0000 ug | ORAL_CAPSULE | Freq: Every day | RESPIRATORY_TRACT | Status: DC
  Administered 2017-04-25: 18 ug via RESPIRATORY_TRACT
  Filled 2017-04-24: qty 5

## 2017-04-24 MED ORDER — PREDNISONE 50 MG PO TABS
50.0000 mg | ORAL_TABLET | Freq: Every day | ORAL | Status: DC
  Administered 2017-04-25: 50 mg via ORAL
  Filled 2017-04-24: qty 1

## 2017-04-24 MED ORDER — INSULIN ASPART 100 UNIT/ML ~~LOC~~ SOLN
0.0000 [IU] | Freq: Every day | SUBCUTANEOUS | Status: DC
  Filled 2017-04-24: qty 1

## 2017-04-24 MED ORDER — GLUCERNA SHAKE PO LIQD: 237.0000 mL | Freq: Three times a day (TID) | ORAL | Status: DC

## 2017-04-24 MED ORDER — TRAZODONE HCL 50 MG PO TABS: 50.0000 mg | ORAL_TABLET | Freq: Every evening | ORAL | Status: DC | PRN

## 2017-04-24 MED ORDER — VITAMIN D 1000 UNITS PO TABS
2000.0000 [IU] | ORAL_TABLET | Freq: Every day | ORAL | Status: DC
  Administered 2017-04-24 – 2017-04-25 (×2): 2000 [IU] via ORAL
  Filled 2017-04-24 (×2): qty 2

## 2017-04-24 MED ORDER — ACETAMINOPHEN 650 MG RE SUPP: 650.0000 mg | Freq: Four times a day (QID) | RECTAL | Status: DC | PRN

## 2017-04-24 MED ORDER — HEPARIN SODIUM (PORCINE) 5000 UNIT/ML IJ SOLN
5000.0000 [IU] | Freq: Three times a day (TID) | INTRAMUSCULAR | Status: DC
  Administered 2017-04-24 (×2): 5000 [IU] via SUBCUTANEOUS
  Filled 2017-04-24 (×3): qty 1

## 2017-04-24 MED ORDER — ALBUTEROL SULFATE (2.5 MG/3ML) 0.083% IN NEBU: 2.5000 mg | INHALATION_SOLUTION | Freq: Four times a day (QID) | RESPIRATORY_TRACT | Status: DC | PRN

## 2017-04-24 MED ORDER — TIOTROPIUM BROMIDE MONOHYDRATE 2.5 MCG/ACT IN AERS: 1.0000 | INHALATION_SPRAY | Freq: Every day | RESPIRATORY_TRACT | Status: DC

## 2017-04-24 MED ORDER — MOMETASONE FURO-FORMOTEROL FUM 100-5 MCG/ACT IN AERO
2.0000 | INHALATION_SPRAY | Freq: Two times a day (BID) | RESPIRATORY_TRACT | Status: DC
  Administered 2017-04-24 – 2017-04-25 (×2): 2 via RESPIRATORY_TRACT
  Filled 2017-04-24: qty 8.8

## 2017-04-24 MED ORDER — ACETAMINOPHEN 325 MG PO TABS: 650.0000 mg | ORAL_TABLET | Freq: Four times a day (QID) | ORAL | Status: DC | PRN

## 2017-04-24 MED ORDER — IPRATROPIUM-ALBUTEROL 0.5-2.5 (3) MG/3ML IN SOLN: 3.0000 mL | Freq: Three times a day (TID) | RESPIRATORY_TRACT | Status: DC

## 2017-04-24 MED ORDER — GABAPENTIN 600 MG PO TABS
600.0000 mg | ORAL_TABLET | Freq: Two times a day (BID) | ORAL | Status: DC
  Administered 2017-04-24 – 2017-04-25 (×3): 600 mg via ORAL
  Filled 2017-04-24 (×3): qty 1

## 2017-04-24 NOTE — Progress Notes (Signed)
Inpatient Diabetes Program Recommendations  AACE/ADA: New Consensus Statement on Inpatient Glycemic Control (2015)  Target Ranges:  Prepandial:   less than 140 mg/dL      Peak postprandial:   less than 180 mg/dL (1-2 hours)      Critically ill patients:  140 - 180 mg/dL  Results for Valerie Norris, Valerie Norris (MRN [id number]) as of 04/24/2017 11:23  Ref. Range 04/24/2017 08:02  Glucose-Capillary Latest Ref Range: 65 - 99 mg/dL 409139 (H)   Results for Valerie Norris, Valerie Norris (MRN [id number]) as of 04/24/2017 11:23  Ref. Range 04/23/2017 19:09 04/24/2017 04:08  Glucose Latest Ref Range: 65 - 99 mg/dL 956149 (H) 213303 (H)   Review of Glycemic Control  Diabetes history: Note in old notes in Care Everywhere pt has DM2 with diet control; but not listed on H&P Outpatient Diabetes medications: None Current orders for Inpatient glycemic control: None  Inpatient Diabetes Program Recommendations: Correction (SSI): While inpatient and ordered steroids, please consider ordering CBGs with Novolog correction scale ACHS. HgbA1C: Please consider ordering an A1C to evaluate glycemic control over the past 2-3 months.  NOTE: Patient is ordered Solumedrol 60 mg Q6H which is contributing to hyperglycemia. Also noted patient has Prednisone 10 mg daily listed on home medication list. Per current H&P, DM is not listed in past medical history. However, noted in Care Everywhere patient has DM2 (diet controlled) noted.  Thanks, Orlando PennerMarie Hamid Brookens, RN, MSN, CDE Diabetes Coordinator Inpatient Diabetes Program [phone] (Team Pager from 8am to 5pm)

## 2017-04-24 NOTE — Progress Notes (Signed)
PHARMACY - PHYSICIAN COMMUNICATION CRITICAL VALUE ALERT - BLOOD CULTURE IDENTIFICATION (BCID)  Valerie Norris is an 62 y.o. female who [facility]2017 with a chief complaint of acute respiratory failure   Assessment:   (include suspected source if known)  Name of physician (or Provider) Contacted: Imogene Burnhen  Current antibiotics: none   Changes to prescribed antibiotics recommended:  Physician would like to monitor. Patient does not seem to have an infectious process.   Results for orders placed or performed during the hospital encounter of 04/23/17  Blood Culture ID Panel (Reflexed) (Collected: 04/23/2017  7:09 PM)  Result Value Ref Range   Enterococcus species NOT DETECTED NOT DETECTED   Vancomycin resistance NOT DETECTED NOT DETECTED   Listeria monocytogenes NOT DETECTED NOT DETECTED   Staphylococcus species NOT DETECTED NOT DETECTED   Staphylococcus aureus NOT DETECTED NOT DETECTED   Methicillin resistance NOT DETECTED NOT DETECTED   Streptococcus species DETECTED (A) NOT DETECTED   Streptococcus agalactiae NOT DETECTED NOT DETECTED   Streptococcus pneumoniae NOT DETECTED NOT DETECTED   Streptococcus pyogenes NOT DETECTED NOT DETECTED   Acinetobacter baumannii NOT DETECTED NOT DETECTED   Enterobacteriaceae species NOT DETECTED NOT DETECTED   Enterobacter cloacae complex NOT DETECTED NOT DETECTED   Escherichia coli NOT DETECTED NOT DETECTED   Klebsiella oxytoca NOT DETECTED NOT DETECTED   Klebsiella pneumoniae NOT DETECTED NOT DETECTED   Proteus species NOT DETECTED NOT DETECTED   Serratia marcescens NOT DETECTED NOT DETECTED   Carbapenem resistance NOT DETECTED NOT DETECTED   Haemophilus influenzae NOT DETECTED NOT DETECTED   Neisseria meningitidis NOT DETECTED NOT DETECTED   Pseudomonas aeruginosa NOT DETECTED NOT DETECTED   Candida albicans NOT DETECTED NOT DETECTED   Candida glabrata NOT DETECTED NOT DETECTED   Candida krusei NOT DETECTED NOT DETECTED   Candida parapsilosis NOT DETECTED NOT DETECTED   Candida tropicalis NOT DETECTED NOT DETECTED    Destry Bezdek D 04/24/2017  5:14 PM

## 2017-04-24 NOTE — Progress Notes (Signed)
Patient upset that her home pain medication (oxycodone 15 mg) was not ordered. Patient refusing Vicodin ordered. Dr. Imogene Burnhen paged and returned call. Dr. Imogene Burnhen requesting pharmacy do a medication reconciliation before ordering medication for patient. Patient informed of this.  Patient reported that she took her home dose of 15 mg of oxycodone. Pt counseled that it is not appropriate to take home medication while in the hospital.  This RN requested that patient's husband take medication home and patient refrain from taking home medications during hospital stay.

## 2017-04-24 NOTE — H&P (Signed)
[facility] - [place] at [facility]   PATIENT NAME: Valerie Norris    MR#:  [id number]  DATE OF BIRTH:  02/10/1956  DATE OF ADMISSION:  04/23/2017  PRIMARY CARE PHYSICIAN: System, Pcp Not In   REQUESTING/REFERRING PHYSICIAN:   CHIEF COMPLAINT:   Chief Complaint  Patient presents with  . Shortness of Breath    HISTORY OF PRESENT ILLNESS: Valerie Norris  is a 62 y.o. female with a known history of COPD hypertension arthritis and asthma. Patient was brought to emergency room by EMS for acute confusion and shortness of breath started a few hours before arrival to emergency room. Patient has been taking oxycodone for pain in her joints.  It is unclear how many pills she took today, as she is too confused to provide details. Upon evaluation in the emergency room noted with hypoxia and hypercapnia.  She was placed on BiPAP with improvement in her symptoms. Per family, the patient did not have any fever or chills, no chest pain or cough; no nausea /vomiting/ bleeding. She is admitted for further evaluation and treatment.  PAST MEDICAL HISTORY:   Past Medical History:  Diagnosis Date  . Arthritis   . Asthma   . COPD (chronic obstructive pulmonary disease) (HCC)   . GERD (gastroesophageal reflux disease)   . Hypertension     PAST SURGICAL HISTORY:  Past Surgical History:  Procedure Laterality Date  . BACK SURGERY      SOCIAL HISTORY:  Social History   Tobacco Use  . Smoking status: Former Games developer  . Smokeless tobacco: Never Used  Substance Use Topics  . Alcohol use: No    FAMILY HISTORY:  Family History  Problem Relation Age of Onset  . Heart attack Mother   . COPD Father   . Heart failure Brother     DRUG ALLERGIES:  Allergies  Allergen Reactions  . Methadone     Other reaction(s): Unknown  . Tetracyclines & Related Nausea And Vomiting  . Latex Rash    REVIEW OF SYSTEMS:   CONSTITUTIONAL: No fever, fatigue or weakness.  EYES: No  blurred or double vision.  EARS, NOSE, AND THROAT: No tinnitus or ear pain.  RESPIRATORY: Positive for shortness of breath. No cough, wheezing or hemoptysis.  CARDIOVASCULAR: No chest pain, orthopnea, edema.  GASTROINTESTINAL: No nausea, vomiting, diarrhea or abdominal pain.  GENITOURINARY: No dysuria, hematuria.  ENDOCRINE: No polyuria, nocturia,  HEMATOLOGY: No anemia, easy bruising or bleeding SKIN: No rash or lesion. MUSCULOSKELETAL: No joint pain or arthritis.   NEUROLOGIC: Altered mental status.  PSYCHIATRY: No anxiety or depression.   MEDICATIONS AT HOME:  Prior to Admission medications   Medication Sig Start Date End Date Taking? Authorizing Provider  albuterol (PROVENTIL HFA;VENTOLIN HFA) 108 (90 Base) MCG/ACT inhaler two puffs every 4-6 hours as needed    [provider]  Cholecalciferol (VITAMIN D3) 2000 units capsule Take 2,000 Units by mouth daily.  03/08/16   [provider]  DULERA 100-5 MCG/ACT AERO Inhale 2 puffs into the lungs 2 (two) times daily. 07/01/16   [provider]  EVZIO 2 MG/0.4ML SOAJ Inject 1 Dose into the muscle once. 05/23/16   [provider]  feeding supplement, GLUCERNA SHAKE, (GLUCERNA SHAKE) LIQD Take 237 mLs by mouth 3 (three) times daily between meals. 08/23/16   Katharina Caper, MD  gabapentin (NEURONTIN) 600 MG tablet Take 600 mg by mouth as needed.  04/02/16   [provider]  gabapentin (NEURONTIN) 800 MG tablet  Two (2) times a day.  09/03/13   [provider]  guaiFENesin-dextromethorphan (ROBITUSSIN DM) 100-10 MG/5ML syrup Take 5 mLs by mouth every 4 (four) hours as needed for cough. 08/23/16   Katharina CaperVaickute, Rima, MD  ipratropium-albuterol (DUONEB) 0.5-2.5 (3) MG/3ML SOLN Take 3 mLs by nebulization every 4 (four) hours. 08/23/16   Katharina CaperVaickute, Rima, MD  magnesium oxide (MAGOX 400) 400 (241.3 Mg) MG tablet Take 1 tablet (400 mg total) by mouth daily. 08/23/16   Katharina CaperVaickute, Rima, MD  Omega-3 1000 MG CAPS 2 (two)  times daily.    [provider]  omeprazole (PRILOSEC) 40 MG capsule TK 1 C PO QD. 04/02/16   [provider]  oxybutynin (DITROPAN) 5 MG tablet Take 5 mg by mouth 2 (two) times daily.  02/26/16   [provider]  oxyCODONE (ROXICODONE) 15 MG immediate release tablet Take 1 tablet by mouth 4 (four) times daily. 07/30/16   [provider]  predniSONE (DELTASONE) 10 MG tablet Take 10 mg by mouth daily. 03/13/17   [provider]  promethazine (PHENERGAN) 25 MG tablet once daily.    [provider]  ranitidine (ZANTAC) 300 MG tablet TK 1 T PO QHS 02/26/16   [provider]  sertraline (ZOLOFT) 100 MG tablet Take 200 mg by mouth. 07/01/16 07/01/17  [provider]  Teriparatide, Recombinant, (FORTEO) 600 MCG/2.4ML SOLN Inject 20 mcg into the skin.  04/20/16   [provider]  theophylline (THEODUR) 300 MG 12 hr tablet Take 300 mg by mouth daily.  03/26/16 03/26/17  [provider]  Tiotropium Bromide Monohydrate 2.5 MCG/ACT AERS Inhale 1 capsule into the lungs daily.  03/12/16   [provider]  traZODone (DESYREL) 50 MG tablet TK 1 T PO QHS 03/22/16   [provider]      PHYSICAL EXAMINATION:   VITAL SIGNS: Blood pressure (!) 116/40, pulse 63, temperature 97.8 F (36.6 C), temperature source Oral, resp. rate 18, height 5\' 2"  (1.575 m), weight 50.7 kg (111 lb 11.2 oz), SpO2 93 %.  GENERAL:  62 y.o.-year-old patient lying in the bed, in mild respiratory distress, currently on BiPAP.  EYES: Pupils equal, round, reactive to light and accommodation. No scleral icterus. Extraocular muscles intact.  HEENT: Head atraumatic, normocephalic. Oropharynx and nasopharynx clear.  NECK:  Supple, no jugular venous distention. No thyroid enlargement, no tenderness.  LUNGS: Reduced breath sounds and scattered wheezing, bilaterally. No use of accessory muscles of respiration.  CARDIOVASCULAR: S1, S2 normal. No  murmurs, rubs, or gallops.  ABDOMEN: Soft, nontender, nondistended. Bowel sounds present. No organomegaly or mass.  EXTREMITIES: No pedal edema, cyanosis, or clubbing.  NEUROLOGIC: Patient is confused disoriented, but moving all her extremities.  No focal weakness noted gait not checked.  PSYCHIATRIC: The patient is somnolent and confused.  SKIN: No obvious rash, lesion, or ulcer.   LABORATORY PANEL:   CBC Recent Labs  Lab 04/23/17 1909  WBC 8.8  HGB 12.0  HCT 35.0  PLT 157  MCV 85.4  MCH 29.2  MCHC 34.2  RDW 13.3  LYMPHSABS 0.7*  MONOABS 0.4  EOSABS 0.1  BASOSABS 0.0   ------------------------------------------------------------------------------------------------------------------  Chemistries  Recent Labs  Lab 04/23/17 1909  NA 139  K 3.9  CL 95*  CO2 34*  GLUCOSE 149*  BUN 11  CREATININE 0.56  CALCIUM 9.1  MG 1.7  AST 22  ALT 10*  ALKPHOS 51  BILITOT 1.0   ------------------------------------------------------------------------------------------------------------------ estimated creatinine clearance is 56.9 mL/min (by C-G formula  based on SCr of 0.56 mg/dL). ------------------------------------------------------------------------------------------------------------------ No results for input(s): TSH, T4TOTAL, T3FREE, THYROIDAB in the last 72 hours.  Invalid input(s): FREET3   Coagulation profile No results for input(s): INR, PROTIME in the last 168 hours. ------------------------------------------------------------------------------------------------------------------- No results for input(s): DDIMER in the last 72 hours. -------------------------------------------------------------------------------------------------------------------  Cardiac Enzymes Recent Labs  Lab 04/23/17 1909  TROPONINI <0.03   ------------------------------------------------------------------------------------------------------------------ Invalid input(s):  POCBNP  ---------------------------------------------------------------------------------------------------------------  Urinalysis    Component Value Date/Time   COLORURINE YELLOW (A) 04/23/2017 1909   APPEARANCEUR CLEAR (A) 04/23/2017 1909   LABSPEC 1.006 04/23/2017 1909   PHURINE 5.0 04/23/2017 1909   GLUCOSEU NEGATIVE 04/23/2017 1909   HGBUR SMALL (A) 04/23/2017 1909   BILIRUBINUR NEGATIVE 04/23/2017 1909   KETONESUR NEGATIVE 04/23/2017 1909   PROTEINUR NEGATIVE 04/23/2017 1909   NITRITE NEGATIVE 04/23/2017 1909   LEUKOCYTESUR TRACE (A) 04/23/2017 1909     RADIOLOGY: Ct Head Wo Contrast  Result Date: 04/23/2017 CLINICAL DATA:  Cough for 2 days EXAM: CT HEAD WITHOUT CONTRAST TECHNIQUE: Contiguous axial images were obtained from the base of the skull through the vertex without intravenous contrast. COMPARISON:  None. FINDINGS: Brain: There is no mass effect, midline shift, or acute intracranial hemorrhage. Calcifications on the anterior falx are noted. Brain parenchyma and ventricular system are within normal limits. Vascular: No hyperdense vessel or unexpected calcification. Skull: Cranium is intact. Sinuses/Orbits: There is mucosal thickening and partial opacification of posterior ethmoid air cells in the sphenoid sinus. Mastoid air cells are clear. Other: Noncontributory. IMPRESSION: No acute intracranial pathology. Electronically Signed   By: Jolaine Click M.D.   On: 04/23/2017 19:42   Dg Chest Port 1 View  Result Date: 04/23/2017 CLINICAL DATA:  Shortness of breath EXAM: PORTABLE CHEST 1 VIEW COMPARISON:  08/21/2016 FINDINGS: The heart size and mediastinal contours are within normal limits. Both lungs are clear. The visualized skeletal structures are unremarkable. IMPRESSION: No active disease. Electronically Signed   By: Elige Ko   On: 04/23/2017 19:30    EKG: Orders placed or performed during the hospital encounter of 04/23/17  . ED EKG 12-Lead  . ED EKG 12-Lead     IMPRESSION AND PLAN:  1.  Acute hypoxic and hypercapnic respiratory failure, likely related to COPD exacerbation and narcotic use.  She is currently, clinically improved status post BiPAP use.  Will continue to use CPAP at night as needed.  We will continue oxygen therapy as needed, also duo nebs and steroids. 2.  Acute COPD exacerbation, management as above. 3.  Acute encephalopathy, likely related to hypercapnia, slowly improving.  Will avoid narcotics for right now.  Continue to monitor clinically closely.  CAT scan of the brain is noted without any acute changes. 4.  Chronic pain from arthritis, will use Tylenol as needed for now.  All the records are reviewed and case discussed with ED provider. Management plans discussed with the patient, family and they are in agreement.  CODE STATUS:    Code Status Orders  (From admission, onward)        Start     Ordered   04/24/17 0042  Full code  Continuous     04/24/17 0041    Code Status History    Date Active Date Inactive Code Status Order ID Comments User Context   08/22/2016 01:20 08/23/2016 19:51 Full Code [id number]  Oralia Manis, MD Inpatient       TOTAL TIME TAKING CARE OF THIS PATIENT: 40 minutes.    Cammy Copa M.D on  04/24/2017 at 2:11 AM  Between 7am to 6pm - Pager - [phone]  After 6pm go to [email or web address] - password [email or web address]  [facility]  Office  [phone]  CC: Primary care physician; System, Pcp Not In

## 2017-04-24 NOTE — Progress Notes (Signed)
[facility] - [place] at [facility]   PATIENT NAME: Valerie Norris    MR#:  [id number]  DATE OF BIRTH:  May 05, 1955  SUBJECTIVE:  CHIEF COMPLAINT:   Chief Complaint  Patient presents with  . Shortness of Breath   The patient has better shortness of breath and cough, no wheezing, on oxygen 3 L REVIEW OF SYSTEMS:  Review of Systems  Constitutional: Negative for chills, fever and malaise/fatigue.  HENT: Negative for sore throat.   Eyes: Negative for blurred vision and double vision.  Respiratory: Positive for cough and shortness of breath. Negative for hemoptysis, wheezing and stridor.   Cardiovascular: Negative for chest pain, palpitations, orthopnea and leg swelling.  Gastrointestinal: Negative for abdominal pain, blood in stool, diarrhea, melena, nausea and vomiting.  Genitourinary: Negative for dysuria, flank pain and hematuria.  Musculoskeletal: Negative for back pain and joint pain.  Skin: Negative for rash.  Neurological: Negative for dizziness, sensory change, focal weakness, seizures, loss of consciousness, weakness and headaches.  Endo/Heme/Allergies: Negative for polydipsia.  Psychiatric/Behavioral: Negative for depression. The patient is not nervous/anxious.     DRUG ALLERGIES:   Allergies  Allergen Reactions  . Methadone     Other reaction(s): Unknown  . Tetracyclines & Related Nausea And Vomiting  . Latex Rash   VITALS:  Blood pressure (!) 120/53, pulse 63, temperature 98.1 F (36.7 C), temperature source Oral, resp. rate 18, height 5\' 2"  (1.575 m), weight 111 lb 11.2 oz (50.7 kg), SpO2 96 %. PHYSICAL EXAMINATION:  Physical Exam  Constitutional: She is oriented to person, place, and time and well-developed, well-nourished, and in no distress.  HENT:  Head: Normocephalic.  Mouth/Throat: Oropharynx is clear and moist.  Eyes: Conjunctivae and EOM are normal. Pupils are equal, round, and reactive to light. No scleral icterus.  Neck: Normal  range of motion. Neck supple. No JVD present. No tracheal deviation present.  Cardiovascular: Normal rate, regular rhythm and normal heart sounds. Exam reveals no gallop.  No murmur heard. Pulmonary/Chest: Effort normal. No respiratory distress. She has no wheezes. She has no rales.  Diminished lung sounds  Abdominal: Soft. Bowel sounds are normal. She exhibits no distension. There is no tenderness. There is no rebound.  Musculoskeletal: Normal range of motion. She exhibits no edema or tenderness.  Neurological: She is alert and oriented to person, place, and time. No cranial nerve deficit.  Skin: No rash noted. No erythema.  Psychiatric: Affect normal.   LABORATORY PANEL:  Female CBC Recent Labs  Lab 04/24/17 0408  WBC 6.3  HGB 11.1*  HCT 32.6*  PLT 142*   ------------------------------------------------------------------------------------------------------------------ Chemistries  Recent Labs  Lab 04/23/17 1909 04/24/17 0408  NA 139 135  K 3.9 3.7  CL 95* 93*  CO2 34* 34*  GLUCOSE 149* 303*  BUN 11 16  CREATININE 0.56 0.62  CALCIUM 9.1 9.0  MG 1.7  --   AST 22  --   ALT 10*  --   ALKPHOS 51  --   BILITOT 1.0  --    RADIOLOGY:  Ct Head Wo Contrast  Result Date: 04/23/2017 CLINICAL DATA:  Cough for 2 days EXAM: CT HEAD WITHOUT CONTRAST TECHNIQUE: Contiguous axial images were obtained from the base of the skull through the vertex without intravenous contrast. COMPARISON:  None. FINDINGS: Brain: There is no mass effect, midline shift, or acute intracranial hemorrhage. Calcifications on the anterior falx are noted. Brain parenchyma and ventricular system are within normal limits. Vascular: No hyperdense vessel or unexpected  calcification. Skull: Cranium is intact. Sinuses/Orbits: There is mucosal thickening and partial opacification of posterior ethmoid air cells in the sphenoid sinus. Mastoid air cells are clear. Other: Noncontributory. IMPRESSION: No acute intracranial  pathology. Electronically Signed   By: Jolaine ClickArthur  Hoss M.D.   On: 04/23/2017 19:42   Dg Chest Port 1 View  Result Date: 04/23/2017 CLINICAL DATA:  Shortness of breath EXAM: PORTABLE CHEST 1 VIEW COMPARISON:  08/21/2016 FINDINGS: The heart size and mediastinal contours are within normal limits. Both lungs are clear. The visualized skeletal structures are unremarkable. IMPRESSION: No active disease. Electronically Signed   By: Elige KoHetal  Patel   On: 04/23/2017 19:30   ASSESSMENT AND PLAN:   1.  Acute on chronic hypoxic and hypercapnic respiratory failure, likely related to COPD exacerbation and narcotic use.   Continue O2 [place] 3L, off BiPAP  Continue duo nebs and taper steroids.  2.  Acute COPD exacerbation, management as above.  3.  Acute encephalopathy, likely related to hypercapnia,  improved. CAT scan of the brain is noted without any acute changes. 4.  Chronic pain from arthritis,  the patient want her narcotics.  Elevated blood sugar due to steroid.  Start sliding scale and check hemoglobin A1c.  All the records are reviewed and case discussed with Care Management/Social Worker. Management plans discussed with the patient, family and they are in agreement.  CODE STATUS: Full Code  TOTAL TIME TAKING CARE OF THIS PATIENT: 32 minutes.   More than 50% of the time was spent in counseling/coordination of care: YES  POSSIBLE D/C IN 1-2 DAYS, DEPENDING ON CLINICAL CONDITION.   Shaune PollackQing Briaunna Grindstaff M.D on 04/24/2017 at 4:21 PM  Between 7am to 6pm - Pager - [phone]  After 6pm go to [email or web address] - Therapist, nutritionalpassword [email or web address]  [facility] [place] [facility]

## 2017-04-25 LAB — GLUCOSE, CAPILLARY: Glucose-Capillary: 117 mg/dL — ABNORMAL HIGH (ref 65–99)

## 2017-04-25 LAB — HIV ANTIBODY (ROUTINE TESTING W REFLEX): HIV SCREEN 4TH GENERATION: NONREACTIVE

## 2017-04-25 MED ORDER — PREDNISONE 10 MG PO TABS: ORAL_TABLET | ORAL | 0 refills | Status: AC

## 2017-04-25 NOTE — Progress Notes (Signed)
Discharge instructions explained to pt and pts husband/ verbalized an understanding/ RX given to pt/  iv removed/ will transport off unit via wheelchair.

## 2017-04-25 NOTE — Discharge Summary (Signed)
[employer] [facility] [place] [facility]   PATIENT NAME: Valerie Norris    MR#:  [id number]  DATE OF BIRTH:  07/08/1955  DATE OF ADMISSION:  04/23/2017   ADMITTING PHYSICIAN: Cammy CopaAngela Maier, MD  DATE OF DISCHARGE: 04/25/2017 10:52 AM  PRIMARY CARE PHYSICIAN: System, Pcp Not In   ADMISSION DIAGNOSIS:  COPD exacerbation (HCC) [J44.1] Acute encephalopathy [G93.40] Acute on chronic respiratory failure with hypoxia and hypercapnia (HCC) [J96.21, J96.22] DISCHARGE DIAGNOSIS:  Active Problems:   Acute respiratory failure (HCC)  SECONDARY DIAGNOSIS:   Past Medical History:  Diagnosis Date  . Arthritis   . Asthma   . COPD (chronic obstructive pulmonary disease) (HCC)   . GERD (gastroesophageal reflux disease)   . Hypertension    HOSPITAL COURSE:    1.Acute on chronic hypoxic and hypercapnic respiratory failure,likely related to COPD exacerbation and narcotic use. Continue home O2 [place] 3L, off BiPAP  Continue duo nebs and taper steroids.  2.Acute COPD exacerbation,management as above.  3.Acute encephalopathy,likely related to hypercapnia, improved. CAT scan of the brain is noted without any acute changes. 4.Chronic pain from arthritis, the patient want her narcotics.  Elevated blood sugar due to steroid.  Started sliding scale and check hemoglobin A1c 5.4.  DISCHARGE CONDITIONS:  Stable, discharged to home today. CONSULTS OBTAINED:   DRUG ALLERGIES:   Allergies  Allergen Reactions  . Methadone     Other reaction(s): Unknown  . Tetracyclines & Related Nausea And Vomiting  . Latex Rash   DISCHARGE MEDICATIONS:   Allergies as of 04/25/2017      Reactions   Methadone    Other reaction(s): Unknown   Tetracyclines & Related Nausea And Vomiting   Latex Rash      Medication List    STOP taking these medications   theophylline 300 MG 12 hr tablet Commonly known as:  THEODUR     TAKE these medications   albuterol 108 (90 Base) MCG/ACT  inhaler Commonly known as:  PROVENTIL HFA;VENTOLIN HFA two puffs every 4-6 hours as needed   DULERA 100-5 MCG/ACT Aero Generic drug:  mometasone-formoterol Inhale 2 puffs into the lungs 2 (two) times daily.   EVZIO 2 MG/0.4ML Soaj Generic drug:  Naloxone HCl Inject 1 Dose into the muscle once.   feeding supplement (GLUCERNA SHAKE) Liqd Take 237 mLs by mouth 3 (three) times daily between meals.   gabapentin 600 MG tablet Commonly known as:  NEURONTIN Take 600 mg by mouth QID.   guaiFENesin-dextromethorphan 100-10 MG/5ML syrup Commonly known as:  ROBITUSSIN DM Take 5 mLs by mouth every 4 (four) hours as needed for cough.   ipratropium-albuterol 0.5-2.5 (3) MG/3ML Soln Commonly known as:  DUONEB Take 3 mLs by nebulization every 4 (four) hours.   magnesium oxide 400 (241.3 Mg) MG tablet Commonly known as:  MAGOX 400 Take 1 tablet (400 mg total) by mouth daily.   Omega-3 1000 MG Caps 2 (two) times daily.   omeprazole 40 MG capsule Commonly known as:  PRILOSEC TK 1 C PO QD.   oxybutynin 5 MG tablet Commonly known as:  DITROPAN Take 5 mg by mouth 2 (two) times daily.   oxyCODONE 15 MG immediate release tablet Commonly known as:  ROXICODONE Take 1 tablet by mouth 4 (four) times daily.   predniSONE 10 MG tablet Commonly known as:  DELTASONE Take 10 mg by mouth daily. What changed:  Another medication with the same name was added. Make sure you understand how and when to take each.  predniSONE 10 MG tablet Commonly known as:  DELTASONE 40 mg po daily for 1 day, 30 mg po daily for 1 day, 20 mg po daily for 1 day, then home dose 10 mg po daily. What changed:  You were already taking a medication with the same name, and this prescription was added. Make sure you understand how and when to take each.   promethazine 25 MG tablet Commonly known as:  PHENERGAN TAKE 1 TABLET AS NEEDED   ranitidine 300 MG tablet Commonly known as:  ZANTAC TK 1 T PO QHS   sertraline 100  MG tablet Commonly known as:  ZOLOFT Take 150 mg by mouth daily.   Tiotropium Bromide Monohydrate 2.5 MCG/ACT Aers Inhale 1 capsule into the lungs daily.   traZODone 50 MG tablet Commonly known as:  DESYREL TK 1 T PO QHS   Vitamin D3 2000 units capsule Take 2,000 Units by mouth daily.        DISCHARGE INSTRUCTIONS:  See AVS. If you experience worsening of your admission symptoms, develop shortness of breath, life threatening emergency, suicidal or homicidal thoughts you must seek medical attention immediately by calling 911 or calling your MD immediately  if symptoms less severe.  You Must read complete instructions/literature along with all the possible adverse reactions/side effects for all the Medicines you take and that have been prescribed to you. Take any new Medicines after you have completely understood and accpet all the possible adverse reactions/side effects.   Please note  You were cared for by a hospitalist during your hospital stay. If you have any questions about your discharge medications or the care you received while you were in the hospital after you are discharged, you can call the unit and asked to speak with the hospitalist on call if the hospitalist that took care of you is not available. Once you are discharged, your primary care physician will handle any further medical issues. Please note that NO REFILLS for any discharge medications will be authorized once you are discharged, as it is imperative that you return to your primary care physician (or establish a relationship with a primary care physician if you do not have one) for your aftercare needs so that they can reassess your need for medications and monitor your lab values.    On the day of Discharge:  VITAL SIGNS:  Blood pressure (!) 130/58, pulse 63, temperature 98 F (36.7 C), temperature source Oral, resp. rate 18, height 5\' 2"  (1.575 m), weight 114 lb 3.2 oz (51.8 kg), SpO2 100 %. PHYSICAL  EXAMINATION:  GENERAL:  62 y.o.-year-old patient lying in the bed with no acute distress.  EYES: Pupils equal, round, reactive to light and accommodation. No scleral icterus. Extraocular muscles intact.  HEENT: Head atraumatic, normocephalic. Oropharynx and nasopharynx clear.  NECK:  Supple, no jugular venous distention. No thyroid enlargement, no tenderness.  LUNGS: Normal breath sounds bilaterally, no wheezing, rales,rhonchi or crepitation. No use of accessory muscles of respiration.  CARDIOVASCULAR: S1, S2 normal. No murmurs, rubs, or gallops.  ABDOMEN: Soft, non-tender, non-distended. Bowel sounds present. No organomegaly or mass.  EXTREMITIES: No pedal edema, cyanosis, or clubbing.  NEUROLOGIC: Cranial nerves II through XII are intact. Muscle strength 5/5 in all extremities. Sensation intact. Gait not checked.  PSYCHIATRIC: The patient is alert and oriented x 3.  SKIN: No obvious rash, lesion, or ulcer.  DATA REVIEW:   CBC Recent Labs  Lab 04/24/17 0408  WBC 6.3  HGB 11.1*  HCT 32.6*  PLT 142*    Chemistries  Recent Labs  Lab 04/23/17 1909 04/24/17 0408  NA 139 135  K 3.9 3.7  CL 95* 93*  CO2 34* 34*  GLUCOSE 149* 303*  BUN 11 16  CREATININE 0.56 0.62  CALCIUM 9.1 9.0  MG 1.7  --   AST 22  --   ALT 10*  --   ALKPHOS 51  --   BILITOT 1.0  --      Microbiology Results  Results for orders placed or performed during the hospital encounter of 04/23/17  Blood Culture (routine x 2)     Status: None (Preliminary result)   Collection Time: 04/23/17  7:09 PM  Result Value Ref Range Status   Specimen Description   Final    BLOOD LAC Performed at [facility], [address], [place], [place] [address]    Special Requests   Final    BOTTLES DRAWN AEROBIC AND ANAEROBIC Blood Culture adequate volume Performed at [facility], [address], [place], [place] [address]    Culture  Setup Time   Final    GRAM POSITIVE COCCI AEROBIC BOTTLE  ONLY CRITICAL RESULT CALLED TO, READ BACK BY AND VERIFIED WITH: TELDRIN JAMES AT 1707 ON 04/24/2017 JJB    Culture GRAM POSITIVE COCCI  Final   Report Status PENDING  Incomplete  Blood Culture (routine x 2)     Status: None (Preliminary result)   Collection Time: 04/23/17  7:09 PM  Result Value Ref Range Status   Specimen Description BLOOD RIGHT ARM  Final   Special Requests   Final    BOTTLES DRAWN AEROBIC AND ANAEROBIC Blood Culture adequate volume   Culture   Final    NO GROWTH 2 DAYS Performed at [facility], [address] [place], [place] [address]    Report Status PENDING  Incomplete  Blood Culture ID Panel (Reflexed)     Status: Abnormal   Collection Time: 04/23/17  7:09 PM  Result Value Ref Range Status   Enterococcus species NOT DETECTED NOT DETECTED Final   Vancomycin resistance NOT DETECTED NOT DETECTED Final   Listeria monocytogenes NOT DETECTED NOT DETECTED Final   Staphylococcus species NOT DETECTED NOT DETECTED Final   Staphylococcus aureus NOT DETECTED NOT DETECTED Final   Methicillin resistance NOT DETECTED NOT DETECTED Final   Streptococcus species DETECTED (A) NOT DETECTED Final    Comment: Not Enterococcus species, Streptococcus agalactiae, Streptococcus pyogenes, or Streptococcus pneumoniae. CRITICAL RESULT CALLED TO, READ BACK BY AND VERIFIED WITH: TELDRIN JAMES AT 1707 ON 04/24/2017 JJB    Streptococcus agalactiae NOT DETECTED NOT DETECTED Final   Streptococcus pneumoniae NOT DETECTED NOT DETECTED Final   Streptococcus pyogenes NOT DETECTED NOT DETECTED Final   Acinetobacter baumannii NOT DETECTED NOT DETECTED Final   Enterobacteriaceae species NOT DETECTED NOT DETECTED Final   Enterobacter cloacae complex NOT DETECTED NOT DETECTED Final   Escherichia coli NOT DETECTED NOT DETECTED Final   Klebsiella oxytoca NOT DETECTED NOT DETECTED Final   Klebsiella pneumoniae NOT DETECTED NOT DETECTED Final   Proteus species NOT DETECTED NOT DETECTED Final    Serratia marcescens NOT DETECTED NOT DETECTED Final   Carbapenem resistance NOT DETECTED NOT DETECTED Final   Haemophilus influenzae NOT DETECTED NOT DETECTED Final   Neisseria meningitidis NOT DETECTED NOT DETECTED Final   Pseudomonas aeruginosa NOT DETECTED NOT DETECTED Final   Candida albicans NOT DETECTED NOT DETECTED Final   Candida glabrata NOT DETECTED NOT DETECTED Final  Candida krusei NOT DETECTED NOT DETECTED Final   Candida parapsilosis NOT DETECTED NOT DETECTED Final   Candida tropicalis NOT DETECTED NOT DETECTED Final    Comment: Performed at [facility], [address] [place], [place] [address]    RADIOLOGY:  No results found.   Management plans discussed with the patient, family and they are in agreement.  CODE STATUS: Prior   TOTAL TIME TAKING CARE OF THIS PATIENT: 32 minutes.    Shaune Pollack M.D on 04/25/2017 at 4:02 PM  Between 7am to 6pm - Pager - [phone]  After 6pm go to [email or web address] - Social research officer, government  [employer] [facility] [place] [facility]  Office  [phone]  CC: Primary care physician; System, Pcp Not In   Note: This dictation was prepared with Dragon dictation along with smaller phrase technology. Any transcriptional errors that result from this process are unintentional.

## 2017-04-26 LAB — CULTURE, BLOOD (ROUTINE X 2): SPECIAL REQUESTS: ADEQUATE

## 2017-04-28 LAB — CULTURE, BLOOD (ROUTINE X 2)
Culture: NO GROWTH
SPECIAL REQUESTS: ADEQUATE

## 2018-05-29 IMAGING — DX DG CHEST 1V PORT
1 series · 1 of 1 positions shown · non-contrast
Comparison: 08/21/2016

CLINICAL DATA: Shortness of breath

EXAM:
PORTABLE CHEST 1 VIEW

[chest ap]
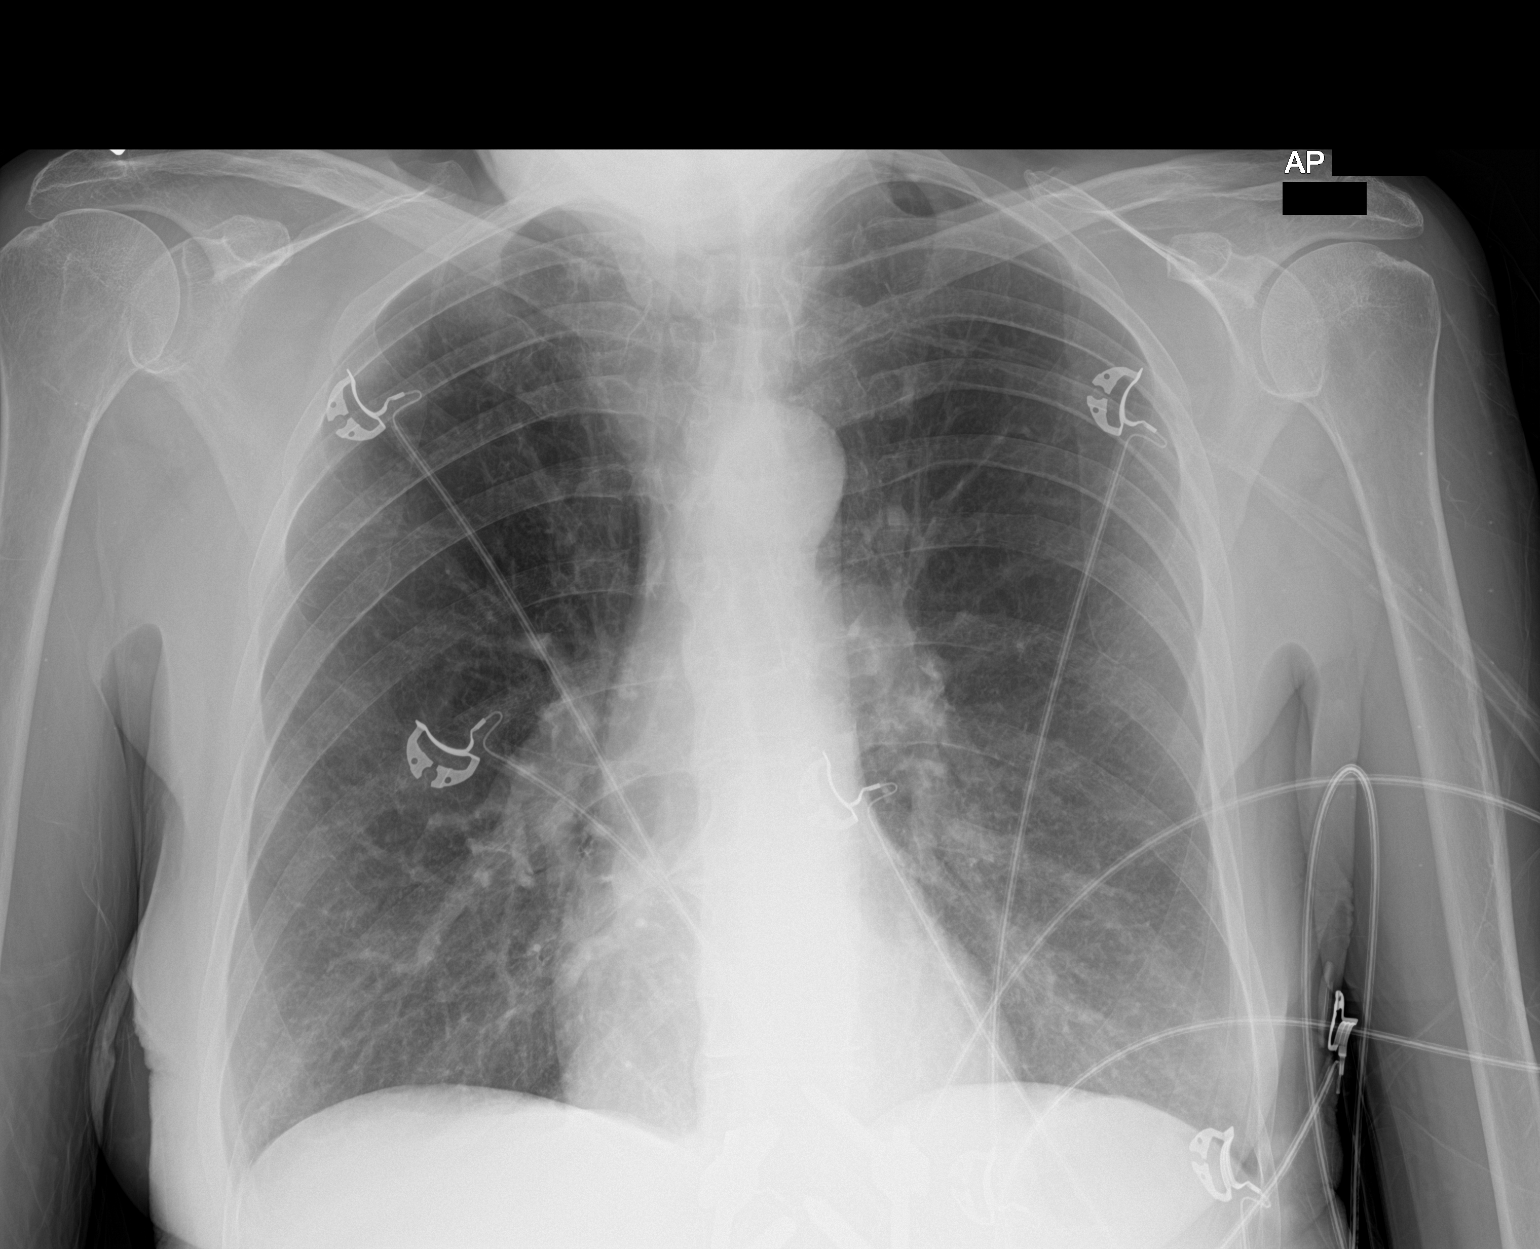

[1 of 1 positions shown; findings below may reference images not displayed]

FINDINGS: The heart size and mediastinal contours are within normal limits.
Both lungs are clear. The visualized skeletal structures are
unremarkable.
IMPRESSION: No active disease.

## 2018-05-29 IMAGING — CT CT HEAD W/O CM
3 series · 15 of 47 positions shown, 18 images · non-contrast
Comparison: None.

CLINICAL DATA: Cough for 2 days

EXAM:
CT HEAD WITHOUT CONTRAST
TECHNIQUE: Contiguous axial images were obtained from the base of the skull
through the vertex without intravenous contrast.

[Series 3: head wo · axial · 0.40mm/px · z∈[+390,+515]mm · 9 of 31 slices shown, 12 images]
[im 3/31  brain]
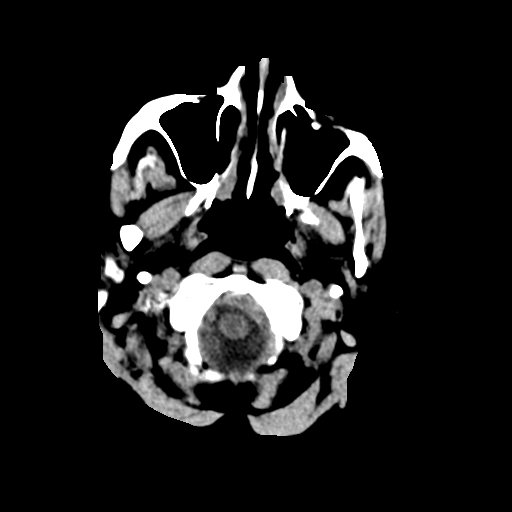
[im 3/31  bone]
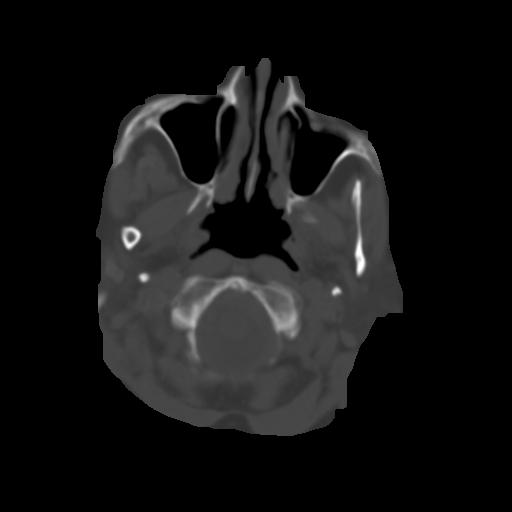
[im 6/31  brain]
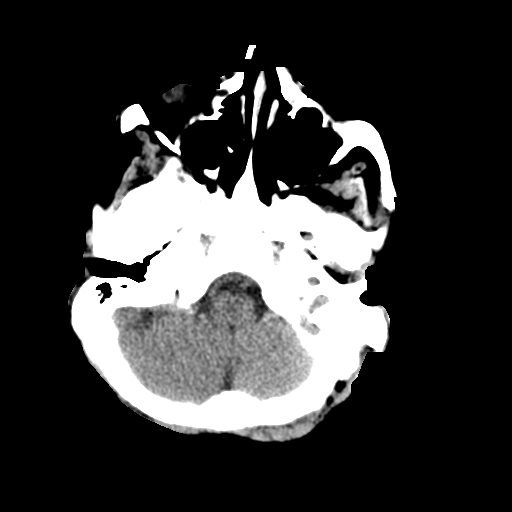
[im 9/31  brain]
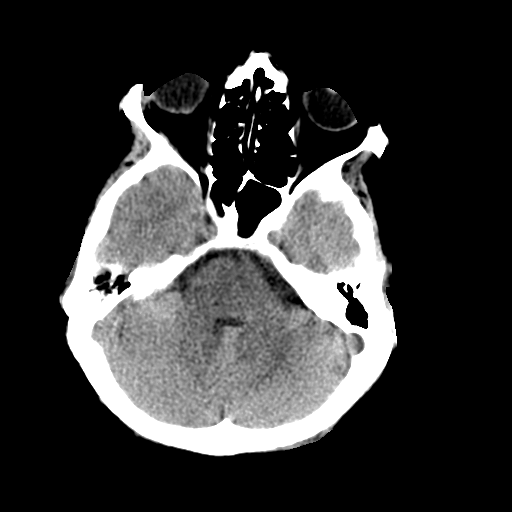
[im 12/31  brain]
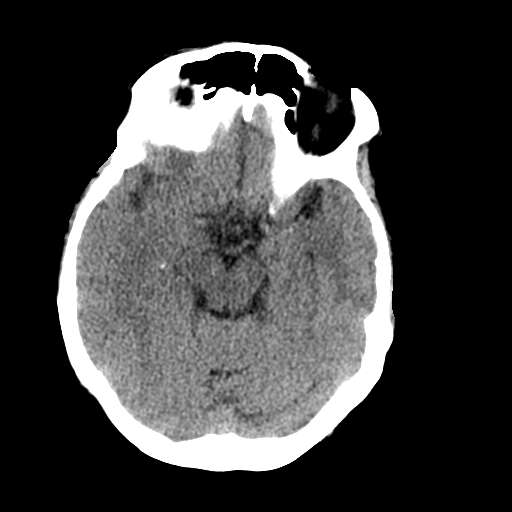
[im 16/31  brain]
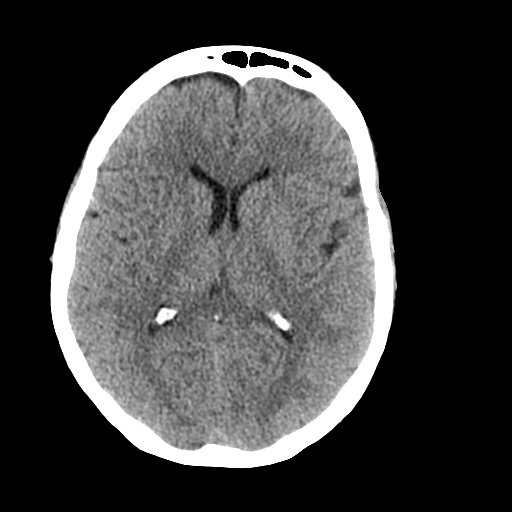
[im 16/31  bone]
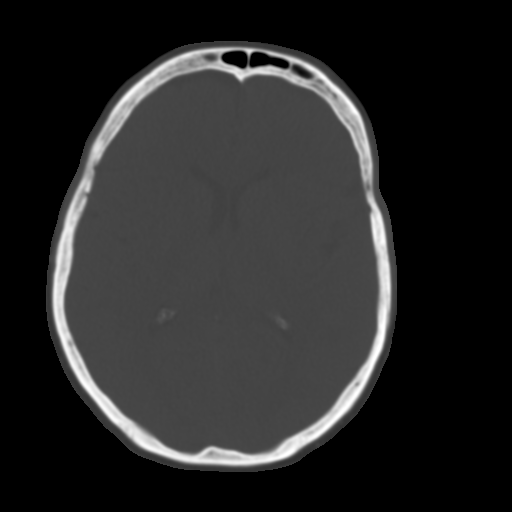
[im 19/31  brain]
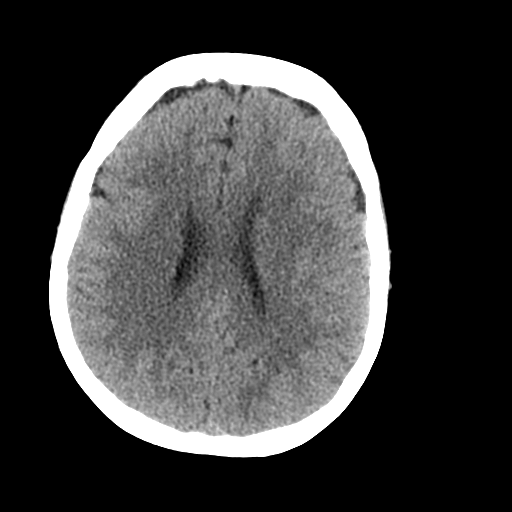
[im 22/31  brain]
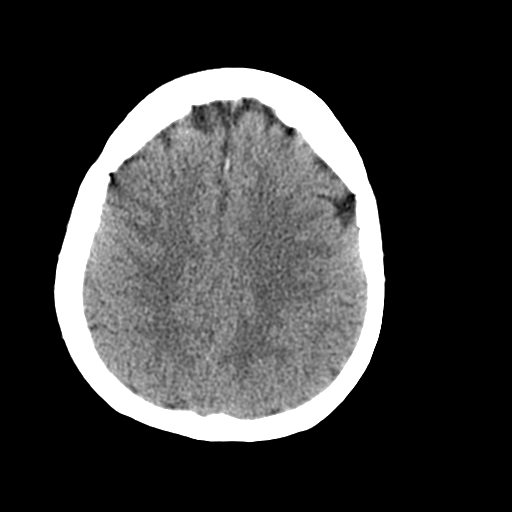
[im 25/31  brain]
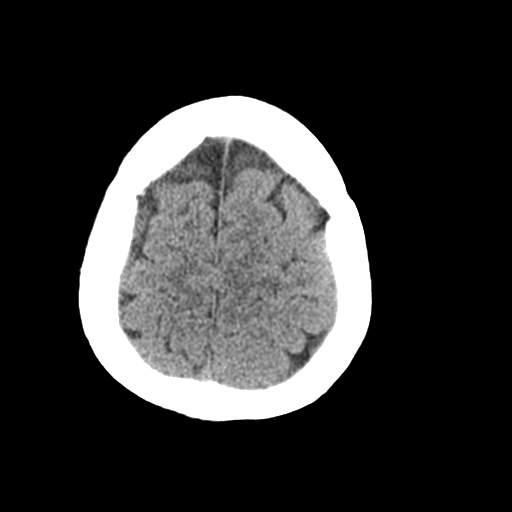
[im 28/31  brain]
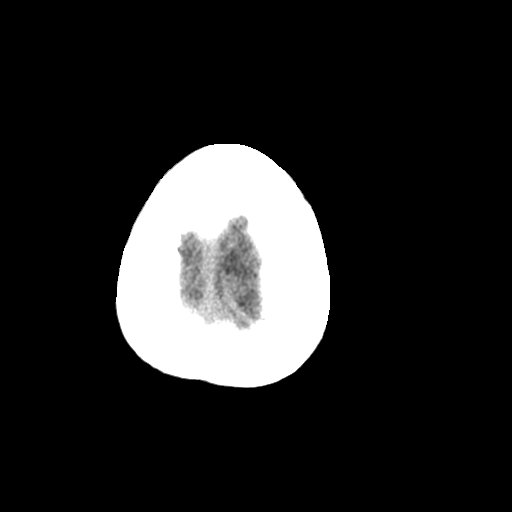
[im 28/31  bone]
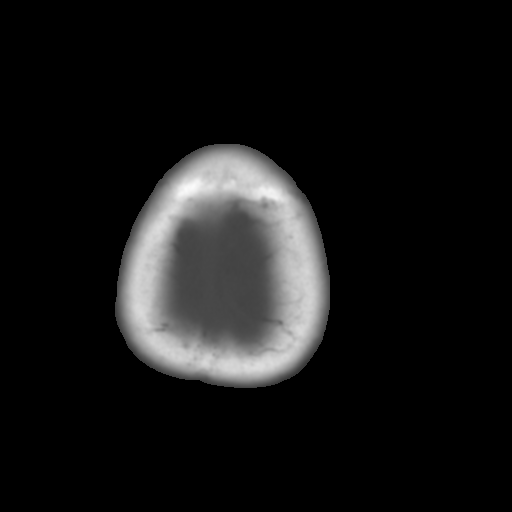

[Series 4: coronal soft tissue · coronal · 0.29mm/px · 3 of 60 slices shown]
[im 20/60  brain]
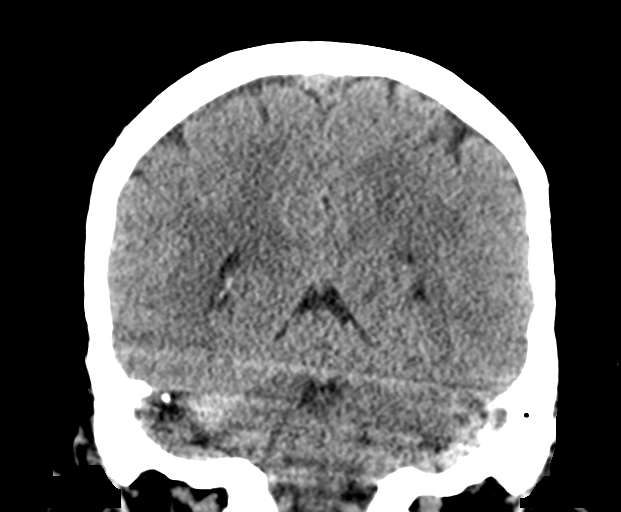
[im 27/60  brain]
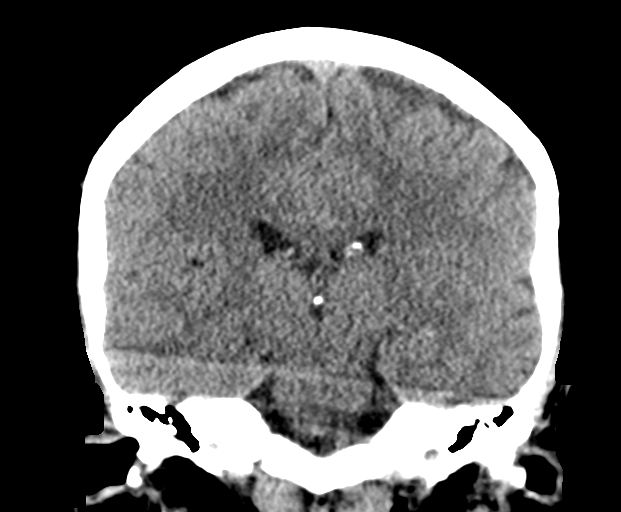
[im 33/60  brain]
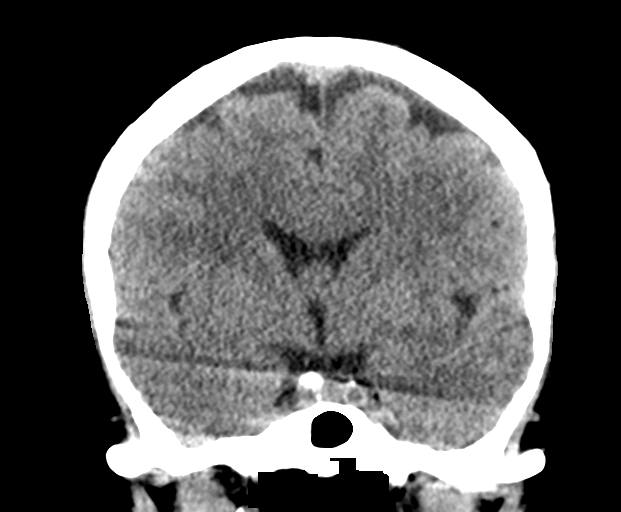

[Series 5: sagittal soft tissue · sagittal · 0.29mm/px · 3 of 48 slices shown]
[im 16/48  brain]
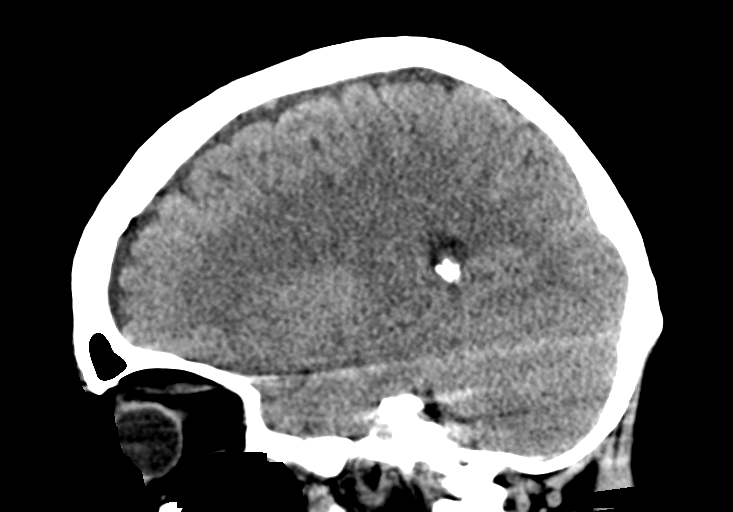
[im 24/48  brain]
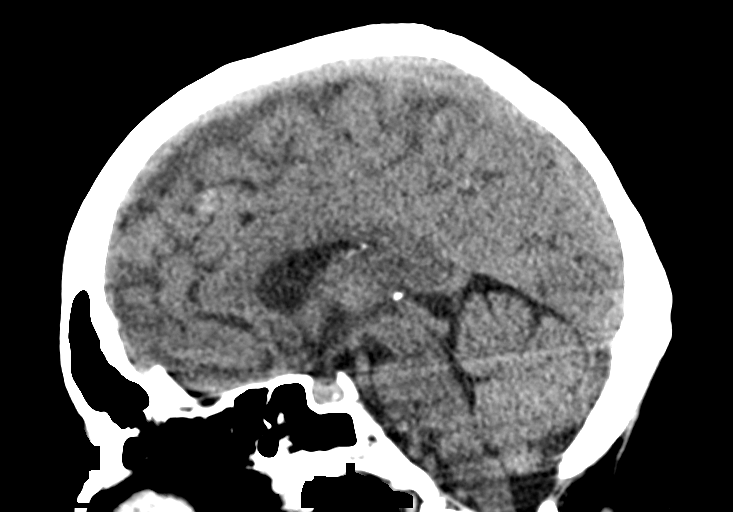
[im 32/48  brain]
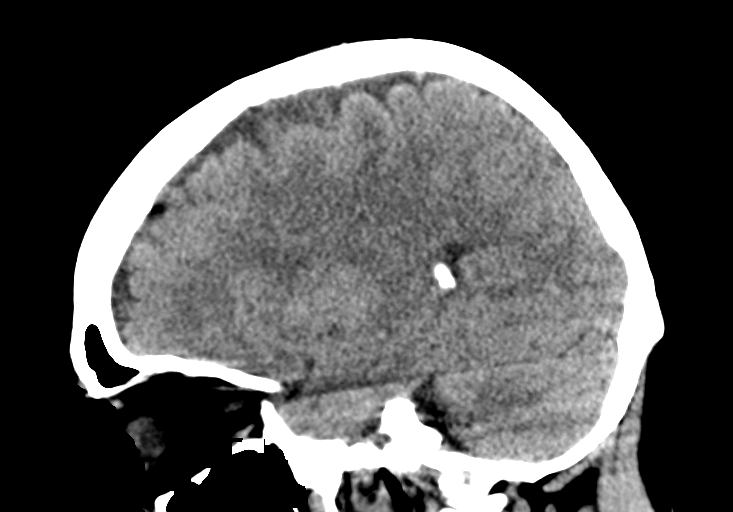

[15 of 47 positions shown; findings below may reference images not displayed]

FINDINGS: Brain: There is no mass effect, midline shift, or acute intracranial
hemorrhage. Calcifications on the anterior falx are noted. Brain
parenchyma and ventricular system are within normal limits.

Vascular: No hyperdense vessel or unexpected calcification.

Skull: Cranium is intact.

Sinuses/Orbits: There is mucosal thickening and partial
opacification of posterior ethmoid air cells in the sphenoid sinus.
Mastoid air cells are clear.

Other: Noncontributory.
IMPRESSION: No acute intracranial pathology.

## 2018-07-13 ENCOUNTER — Telehealth: Payer: Self-pay | Admitting: Primary Care

## 2018-07-13 NOTE — Telephone Encounter (Signed)
T/c to husband to assess palliative needs.  Patient would like hospice but has trilogy. Pulmonary was asked to assess CPAP vs Trilogy. Will make appt. For telemedicine visit.

## 2018-07-14 ENCOUNTER — Telehealth: Payer: Self-pay | Admitting: Primary Care

## 2018-07-14 ENCOUNTER — Other Ambulatory Visit: Payer: Medicare Other | Admitting: Primary Care

## 2018-07-14 NOTE — Telephone Encounter (Signed)
T/c From Husband who states they were not able to do the technology. Wants a phone evaluation tomorrow instead of telemedicine today. Set up appt for 07/15/2018.

## 2018-07-15 ENCOUNTER — Other Ambulatory Visit: Payer: Medicare Other | Admitting: Primary Care

## 2018-07-15 ENCOUNTER — Other Ambulatory Visit: Payer: Self-pay

## 2018-07-15 DIAGNOSIS — J441 Chronic obstructive pulmonary disease with (acute) exacerbation: Secondary | ICD-10-CM

## 2018-07-15 DIAGNOSIS — Z515 Encounter for palliative care: Secondary | ICD-10-CM

## 2018-07-15 DIAGNOSIS — G894 Chronic pain syndrome: Secondary | ICD-10-CM

## 2018-07-15 NOTE — Progress Notes (Signed)
Therapist, nutritional Palliative Care Consult Note Telephone: [phone]  Fax: [phone]  TELEPHONE EVALUATION  STATEMENT Due to the COVID-19 crisis, this TELEPHONE EVALUATION VISIT was done via telephone from my office and it was initiated and consent by this patient and or family.  PATIENT NAME: Valerie Norris DOB: 03-May-1955 MRN: [id number]  [facility] Palliative care was asked to follow patient. This is a follow up visit.  PRIMARY CARE PROVIDER:   Rosemarie Ax, MD  REFERRING PROVIDER:  Rosemarie Ax, MD [address] [facility], [place] [address]  RESPONSIBLE PARTY:   Extended Emergency Contact Information Primary Emergency Contact: Renaldo,Robert M Address: [address]          [place], [place] [address] [place] Home Phone: [phone] Mobile Phone: [phone] Relation: Spouse   ASSESSMENT and RECOMMENDATIONS:   1. Pain management: Chronic 5-10/10 pain reported.  Recommend to convert oxycodone to morphine so as to affordably transition to long acting narcotic form. Recommend 45 mg Morphine ER q 12 hours. Recommend  Morphine IR 10 mg every 2 hours prn to titrate to manage pain more effectively. I anticipate increasing the long acting over a few weeks. Recommend to add 300 mg gabapentin mid-day, pt has been taking 600 mg q 12 hours. Recommend to add 500 mg acetaminophen q 8 hrs. Patient experiences chronic back pain with dyspnea on exertion due to  COPD stage 4. Patient currently sees pain clinic in [place], but may no longer be appropriate given her palliative symptom management needs. Extrication from the treatment contract would need to be addressed.   2. Dyspnea: Continues to be a significant problem and impacts quality of life.  Morphine is considered gold standard for dyspnea relief in end of life, therefore pain management plan will also address dyspnea. Patient is using rescue albuterol 7 x/ day or more and duoneb qid as  baseline. Also on Trelegy. I'd recommend considering 5 mg of prednisone daily as this much albuterol may indicate need for better control, and /or adding budesonide to the nebulizer protocol and switching entirely to nebulized medications vs. Inhaled.  Consult with Pulmonary to ascertain if CPAP could replace Trilogy machine with same symptom relief. Trilogy is considered aggressive care and precludes hospice admission.  3. Mobility/safety: Recommend PT/OT home health services for energy conservation and gait training, obtaining necessary DME. She is  not using an assistive device but reports holding to wall to ambulate. She would benefit from OT helping her with conservation of energy techniques for adls and PT for ambulation/safety.  4. Goals of Care: Currently continues to be a Full Code, will continue to assist to clarify goals of care. Husband also inquires about a few lung procedures he states pulmonology said she would not tolerate. Eduction provide RE disease process. Patient and husband state they were open to hospice services but she does see benefit from the Trilogy. She has never had a CPAP.   Palliative Care will continue to follow for goals of care clarification, symptom management. Return 1 week for Telemedicine visit, family will obtain zoom platform for meeting 3 pm 07/23/2018.  I spent 60  minutes providing this consultation,  from 1400 to 1500. More than 50% of the time in this consultation was spent coordinating communication.   HISTORY OF PRESENT ILLNESS:  Valerie Norris is a 63 y.o. year old female, with multiple medical problems including COPD Gold stage 4, Asthma, OA, DM, Protein calorie malnutrition. Palliative Care was asked to  help address goals of care.   CODE STATUS: FULL  PPS: 40% HOSPICE ELIGIBILITY/DIAGNOSIS: TBD  PAST MEDICAL HISTORY:  Past Medical History:  Diagnosis Date  . Arthritis   . Asthma   . COPD (chronic obstructive pulmonary disease) (HCC)   . GERD  (gastroesophageal reflux disease)   . Hypertension     SOCIAL HX:  Social History   Tobacco Use  . Smoking status: Former Games developermoker  . Smokeless tobacco: Never Used  Substance Use Topics  . Alcohol use: No    ALLERGIES:  Allergies  Allergen Reactions  . Fentanyl     Hallucinations  . Methadone     Other reaction(s): Hallucinations  . Tetracyclines & Related Nausea And Vomiting  . Latex Rash    PERTINENT MEDICATIONS:  Outpatient Encounter Medications as of 07/15/2018  Medication Sig  . albuterol (PROVENTIL HFA;VENTOLIN HFA) 108 (90 Base) MCG/ACT inhaler two puffs every 4-6 hours as needed  . Cholecalciferol (VITAMIN D3) 2000 units capsule Take 2,000 Units by mouth daily.   . DULERA 100-5 MCG/ACT AERO Inhale 2 puffs into the lungs 2 (two) times daily.  Marland Kitchen. EVZIO 2 MG/0.4ML SOAJ Inject 1 Dose into the muscle once.  . feeding supplement, GLUCERNA SHAKE, (GLUCERNA SHAKE) LIQD Take 237 mLs by mouth 3 (three) times daily between meals. (Patient not taking: Reported on 04/24/2017)  . gabapentin (NEURONTIN) 600 MG tablet Take 600 mg by mouth QID.   Marland Kitchen. guaiFENesin-dextromethorphan (ROBITUSSIN DM) 100-10 MG/5ML syrup Take 5 mLs by mouth every 4 (four) hours as needed for cough. (Patient not taking: Reported on 04/24/2017)  . ipratropium-albuterol (DUONEB) 0.5-2.5 (3) MG/3ML SOLN Take 3 mLs by nebulization every 4 (four) hours.  . magnesium oxide (MAGOX 400) 400 (241.3 Mg) MG tablet Take 1 tablet (400 mg total) by mouth daily.  . Omega-3 1000 MG CAPS 2 (two) times daily.  Marland Kitchen. omeprazole (PRILOSEC) 40 MG capsule TK 1 C PO QD.  Marland Kitchen. oxybutynin (DITROPAN) 5 MG tablet Take 5 mg by mouth 2 (two) times daily.   Marland Kitchen. oxyCODONE (ROXICODONE) 15 MG immediate release tablet Take 1 tablet by mouth 4 (four) times daily.  . predniSONE (DELTASONE) 10 MG tablet Take 10 mg by mouth daily.  . predniSONE (DELTASONE) 10 MG tablet 40 mg po daily for 1 day, 30 mg po daily for 1 day, 20 mg po daily for 1 day, then home dose 10  mg po daily.  . promethazine (PHENERGAN) 25 MG tablet TAKE 1 TABLET AS NEEDED  . ranitidine (ZANTAC) 300 MG tablet TK 1 T PO QHS  . sertraline (ZOLOFT) 100 MG tablet Take 150 mg by mouth daily.   . Tiotropium Bromide Monohydrate 2.5 MCG/ACT AERS Inhale 1 capsule into the lungs daily.   . traZODone (DESYREL) 50 MG tablet TK 1 T PO QHS   No facility-administered encounter medications on file as of 07/15/2018.     PHYSICAL EXAM: Reported by patient per telephone evaluation  Wt: 109 lbs, weight 2 years ago 156 lbs.  Pain 6/10 now, highest 10/10, lowest 5/10 General: NAD, frail,  Cardiovascular: denies chest pain Pulmonary: oxygen 4 l / [place]. Occ cough Abdomen: endorses regular bm,, endorses continence GU: endorses  incontinence  Extremities: endorses LE bil edema, tried compression stockings. Needs walker, will check for one. Skin: currently no wounds Neurological: Weakness  Poor sleep due to breathing issues.  Marijo FileKathryn M Luis Sami DNP, AGPCNP-BC

## 2018-07-16 ENCOUNTER — Telehealth: Payer: Self-pay | Admitting: Primary Care

## 2018-07-16 NOTE — Telephone Encounter (Signed)
T/c to patient, instructed to  increase oxycodone 15 mg to qid with two prn doses for uncontrolled pain. Will have TM visit on 07/23/2018 to teach conversion to morphine ER and morphine IR PRN. Patient voices understanding she can take up to 6 oxycodone 15 mg pills a day, and if she needs 6, take them q 4 hrs. Husband also on call and voices understanding. Patient states she uses trilogy every HS and during naps. Dr. Merilynn Finland is in agreement with palliative treatment plan and will prescribe necessary medications.

## 2018-07-16 NOTE — Telephone Encounter (Signed)
T/c to Dr. Merilynn Finland, discussed visit from yesterday and goals of care to include pain management, dyspnea management and addressing home ventilation needs. Will continue to discuss case, call out to pulmonology as well. I will call Dr Merilynn Finland in a few weeks to initiate plan to change narcotics.

## 2018-07-16 NOTE — Telephone Encounter (Signed)
Call returned from Dr Drummond's nurse Selinda Eon RN. (Clinic Nurse): [phone]  She states message from Dr. Okey Regal is Valerie Norris can switch to a bipap and d/c trilogy machine if she desires to do so. He recommends using the settings currently on the trilogy or if not available, use  12/5 Inspiratory/expiratory. I will let Valerie Norris know this on my  next visit with her, for planning for hospice purposes.

## 2018-07-23 ENCOUNTER — Other Ambulatory Visit: Payer: Medicare Other | Admitting: Primary Care

## 2018-07-23 ENCOUNTER — Other Ambulatory Visit: Payer: Self-pay

## 2018-07-23 DIAGNOSIS — J441 Chronic obstructive pulmonary disease with (acute) exacerbation: Secondary | ICD-10-CM

## 2018-07-23 DIAGNOSIS — Z515 Encounter for palliative care: Secondary | ICD-10-CM

## 2018-07-23 DIAGNOSIS — G894 Chronic pain syndrome: Secondary | ICD-10-CM

## 2018-07-23 NOTE — Progress Notes (Signed)
Therap[facility], [facility] Consult Note Telephone: [phone]  Fax: [phone]  TELEHEALTH VISIT STATEMENT  Due to the COVID-19 crisis, this visit was done via telemedicine from my office. It was initiated and consented to by this patient and/or family.   PATIENT NAME: Valerie Norris Aungst DOB: 04/15/1955 MRN: [id number]  PRIMARY CARE PROVIDER:   Rosemarie Axobertson, Noelle, MD  REFERRING PROVIDER:  Rosemarie Axobertson, Noelle, MD [address] [place], [place] [address]  RESPONSIBLE PARTY:   Extended Emergency Contact Information Primary Emergency Contact: Mark,Robert M Address: [address]          [place], [place] [address] [place] Home Phone: [phone] Mobile Phone: [phone] Relation: Spouse  Telemedicine visit with patient and her husband from their home.  ASSESSMENT and RECOMMENDATIONS:   1.Dyspnea: Assess for respiratory infection ASAP. States she is feeling tight in the chest, denies fever but endorses increased DOE. During videoconference she did show some effort while ambulating. Husband just was dx with pneumonia yesterday, negative for Covid 19. Patient states she usually has Levaquin which helps, will defer this to PCP. Phone message left at 1630 for MD team assistant. She it taking albuterol rescue 4-5 x/ daily and endorses compliance with maintenance inhalers.  2. Pain:   Goals is converting to long acting for more even pain control, and to morphine which is far more affordable than long acting oxycodone. They use [facility] on [address] [place].   Please Prescribe 30 mg Morphine ER q 12 hr, #20 AND 15 mg Morphine ER q 12 hr, #20  beginning on 07/27/2018. Sig is take one 30 mg and one 15 mg pill every 12 hours for a total of 45 mg every 12 hours.  For Break through, Prescribe Morphine IR 10 mg q 2 hrs prn, # 30,  beginning on 07/27/2018. This if for a 10 day trial to assess long acting medication needs.  I will f/u with telemed appt on  08/03/2018 to assess and adjust. Pain remains 7/10. She is taking 75-90 mg oxycodone daily which equals 135 mg morphine daily. Accounting for cross tolerance, I'd like to change to 45 mg Morphine ER q 12 hours and let her use break through for several days before adjusting again. Then I can recommend a better twice daily long acting dose upon seeing her prn use.  3. Goals of care: Full Code. Still seeking aggressive care, using trilogy at hs and day time naps.  4. Mobility/safety: Recommend PT/OT home health services for energy conservation and gait training, obtaining necessary DME. She is  not using an assistive device but reports holding to wall to ambulate. She would benefit from OT helping her with conservation of energy techniques for adls and PT for ambulation/safety.  Palliative Care will continue to follow for goals of care clarification, symptom management. Return 1 week for Telemedicine visit, family will obtain zoom platform for meeting 3 pm 07/31/2018.  I spent 60 minutes providing this consultation,  from 1500 to 1600. More than 50% of the time in this consultation was spent coordinating communication.   HISTORY OF PRESENT ILLNESS:  Valerie Norris Thibodaux is a 63 y.o. year old female with multiple medical problems including  COPD Gold stage 4, Asthma, OA, DM, Protein calorie malnutrition. Palliative Care was asked to help address goals of care.   CODE STATUS: FULL  PPS: 40% HOSPICE ELIGIBILITY/DIAGNOSIS: TBD  PAST MEDICAL HISTORY:  Past Medical History:  Diagnosis Date  . Arthritis   .  Asthma   . COPD (chronic obstructive pulmonary disease) (HCC)   . GERD (gastroesophageal reflux disease)   . Hypertension     SOCIAL HX:  Social History   Tobacco Use  . Smoking status: Former Games developer  . Smokeless tobacco: Never Used  Substance Use Topics  . Alcohol use: No    ALLERGIES:  Allergies  Allergen Reactions  . Fentanyl     Hallucinations  . Methadone     Other reaction(s):  Hallucinations  . Tetracyclines & Related Nausea And Vomiting  . Latex Rash     PERTINENT MEDICATIONS:  Outpatient Encounter Medications as of 07/23/2018  Medication Sig  . albuterol (PROVENTIL HFA;VENTOLIN HFA) 108 (90 Base) MCG/ACT inhaler two puffs every 4-6 hours as needed  . Cholecalciferol (VITAMIN D3) 2000 units capsule Take 2,000 Units by mouth daily.   . DULERA 100-5 MCG/ACT AERO Inhale 2 puffs into the lungs 2 (two) times daily.  Marland Kitchen EVZIO 2 MG/0.4ML SOAJ Inject 1 Dose into the muscle once.  . feeding supplement, GLUCERNA SHAKE, (GLUCERNA SHAKE) LIQD Take 237 mLs by mouth 3 (three) times daily between meals. (Patient not taking: Reported on 04/24/2017)  . gabapentin (NEURONTIN) 600 MG tablet Take 600 mg by mouth QID.   Marland Kitchen guaiFENesin-dextromethorphan (ROBITUSSIN DM) 100-10 MG/5ML syrup Take 5 mLs by mouth every 4 (four) hours as needed for cough. (Patient not taking: Reported on 04/24/2017)  . ipratropium-albuterol (DUONEB) 0.5-2.5 (3) MG/3ML SOLN Take 3 mLs by nebulization every 4 (four) hours.  . magnesium oxide (MAGOX 400) 400 (241.3 Mg) MG tablet Take 1 tablet (400 mg total) by mouth daily.  . Omega-3 1000 MG CAPS 2 (two) times daily.  Marland Kitchen omeprazole (PRILOSEC) 40 MG capsule TK 1 C PO QD.  Marland Kitchen oxybutynin (DITROPAN) 5 MG tablet Take 5 mg by mouth 2 (two) times daily.   Marland Kitchen oxyCODONE (ROXICODONE) 15 MG immediate release tablet Take 1 tablet by mouth 4 (four) times daily.  . predniSONE (DELTASONE) 10 MG tablet Take 10 mg by mouth daily.  . predniSONE (DELTASONE) 10 MG tablet 40 mg po daily for 1 day, 30 mg po daily for 1 day, 20 mg po daily for 1 day, then home dose 10 mg po daily.  . promethazine (PHENERGAN) 25 MG tablet TAKE 1 TABLET AS NEEDED  . ranitidine (ZANTAC) 300 MG tablet TK 1 T PO QHS  . sertraline (ZOLOFT) 100 MG tablet Take 150 mg by mouth daily.   . Tiotropium Bromide Monohydrate 2.5 MCG/ACT AERS Inhale 1 capsule into the lungs daily.   . traZODone (DESYREL) 50 MG tablet TK 1 T  PO QHS   No facility-administered encounter medications on file as of 07/23/2018.     PHYSICAL EXAM/ROS:   Reported by patient: Cannot find pulse ox. To check  General: NAD, frail  And ill appearing, thin Cardiovascular: regular rate and rhythm Pulmonary: endorses cough, oxygen on at all times, uses Trilogy ventilator at hs and naps Abdomen: endorses fair appetite. Endorses constipation Extremities: no edema, , effort to ambulate due to deconditioning and COPD Skin: no rashes or wounds on gross exam Neurological: Weakness, alert and oriented x 3, good historian. Taking Clonazepam 1 mg bid with good results for anxiety.  Marijo File DNP, AGPCNP-BC

## 2018-07-27 ENCOUNTER — Telehealth: Payer: Self-pay | Admitting: Primary Care

## 2018-07-27 NOTE — Telephone Encounter (Signed)
T/c from Ms Yance states she has not received the updated morphine prescription. I phoned 07/24/2018 to Dr. Jacqualin Combes office to update her about the plan of care. I sent a copy of my note to her fax. I t had to leave a message. Again, phone contact made to Dr. Merilynn Finland RE changing patient pain control protocol. Spoke with Dr Jacqualin Combes nurse and made sure of fax number.

## 2018-07-27 NOTE — Telephone Encounter (Signed)
T/c to patient and husband to inform that morphine ir will be solution, and to explain dose is 5 ml or 1 teaspoon. Advised to obtain a measure device at the pharmacy. Husband, pcg, voiced understanding. Also apprised that they would receive a narcan dose unit, which they are familiar with. Also advised to have benadryl in the home in case of reaction. Voiced understanding to all.  Requested phone telemedicine f/u tomorrow, will schedule a visit by phone to assess medication response.

## 2018-07-29 ENCOUNTER — Other Ambulatory Visit: Payer: Self-pay

## 2018-07-29 ENCOUNTER — Other Ambulatory Visit: Payer: Medicare Other | Admitting: Primary Care

## 2018-07-29 DIAGNOSIS — G894 Chronic pain syndrome: Secondary | ICD-10-CM

## 2018-07-29 DIAGNOSIS — Z515 Encounter for palliative care: Secondary | ICD-10-CM

## 2018-07-29 DIAGNOSIS — J441 Chronic obstructive pulmonary disease with (acute) exacerbation: Secondary | ICD-10-CM

## 2018-07-29 NOTE — Progress Notes (Signed)
Therap[facility], [facility] Consult Note Telephone: [phone]  Fax: [phone]  TELEphone Evaluation VISIT STATEMENT Due to the COVID-19 crisis, this visit was done via telephone from my office. It was initiated and consented to by this patient and/or family. Due to patient technologic barriers, this encounter was conducted by telephone.   PATIENT NAME: Valerie EndLinda Norris DOB: 03/02/1956 MRN: [id number]  PRIMARY CARE PROVIDER:   Rosemarie Axobertson, Noelle, MD  REFERRING PROVIDER:  Rosemarie Axobertson, Noelle, MD [address] [place], [place] [address]  RESPONSIBLE PARTY:   Extended Emergency Contact Information Primary Emergency Contact: Dziedzic,Robert M Address: [address]          [place], [place] [address] [place] Home Phone: [phone] Mobile Phone: [phone] Relation: Spouse  Phone evaluation call with patient and husband to assess pain control and continue with titration planning.  ASSESSMENT and RECOMMENDATIONS:   1. Pain control: Currently titrating for pain management. Patient changed from oxycodone on Monday to establish long acting protocol. Began on Monday taking 10 mg morphine in the afternoon. Took 45 mg  ER at 11 pm.  On 21st took 45 mg morphine ER at 0830. Took 30 mg btp on Monday day. Took 45 mg ER at 2000. On  4/22 Took 45 mg at 0900, and 20 mg btp at time of this call. States pain is at 7/10, includes neuropathic and somatic pain, but does not improve.  Instructed we started out low by design, and now to give prn dose q 2 hrs x 4 doses for rest of evening to control pain and continue 45 mg bid of Morphine ER. I anticipate increasing her q 12 hr dosing to 60 mg but will determine that with another call tomorrow so that changed dose can be sent to pharmacy on Friday for the weekend.   2. Goals of care: Full Code. Still seeking aggressive care, using trilogy at hs and day time naps.  I spent 15 minutes providing this consultation,  from 1545   to 1600. More than 50% of the time in this consultation was spent coordinating communication.   HISTORY OF PRESENT ILLNESS:  Valerie Norris is a 63 y.o. year old female with multiple medical problems including COPD, OA, Chronic pain. Palliative Care was asked to help address goals of care.   CODE STATUS: FULL  PPS: 40% HOSPICE ELIGIBILITY/DIAGNOSIS: TBD  PAST MEDICAL HISTORY:  Past Medical History:  Diagnosis Date  . Arthritis   . Asthma   . COPD (chronic obstructive pulmonary disease) (HCC)   . GERD (gastroesophageal reflux disease)   . Hypertension     SOCIAL HX:  Social History   Tobacco Use  . Smoking status: Former Games developermoker  . Smokeless tobacco: Never Used  Substance Use Topics  . Alcohol use: No    ALLERGIES:  Allergies  Allergen Reactions  . Fentanyl     Hallucinations  . Methadone     Other reaction(s): Hallucinations  . Tetracyclines & Related Nausea And Vomiting  . Latex Rash     PERTINENT MEDICATIONS:  Outpatient Encounter Medications as of 07/29/2018  Medication Sig  . albuterol (PROVENTIL HFA;VENTOLIN HFA) 108 (90 Base) MCG/ACT inhaler two puffs every 4-6 hours as needed  . Cholecalciferol (VITAMIN D3) 2000 units capsule Take 2,000 Units by mouth daily.   . DULERA 100-5 MCG/ACT AERO Inhale 2 puffs into the lungs 2 (two) times daily.  Marland Kitchen. EVZIO 2 MG/0.4ML SOAJ Inject 1 Dose into the muscle once.  .Marland Kitchen  feeding supplement, GLUCERNA SHAKE, (GLUCERNA SHAKE) LIQD Take 237 mLs by mouth 3 (three) times daily between meals. (Patient not taking: Reported on 04/24/2017)  . gabapentin (NEURONTIN) 600 MG tablet Take 600 mg by mouth QID.   Marland Kitchen guaiFENesin-dextromethorphan (ROBITUSSIN DM) 100-10 MG/5ML syrup Take 5 mLs by mouth every 4 (four) hours as needed for cough. (Patient not taking: Reported on 04/24/2017)  . ipratropium-albuterol (DUONEB) 0.5-2.5 (3) MG/3ML SOLN Take 3 mLs by nebulization every 4 (four) hours.  . magnesium oxide (MAGOX 400) 400 (241.3 Mg) MG tablet Take 1  tablet (400 mg total) by mouth daily.  . Omega-3 1000 MG CAPS 2 (two) times daily.  Marland Kitchen omeprazole (PRILOSEC) 40 MG capsule TK 1 C PO QD.  Marland Kitchen oxybutynin (DITROPAN) 5 MG tablet Take 5 mg by mouth 2 (two) times daily.   Marland Kitchen oxyCODONE (ROXICODONE) 15 MG immediate release tablet Take 1 tablet by mouth 4 (four) times daily.  . predniSONE (DELTASONE) 10 MG tablet Take 10 mg by mouth daily.  . predniSONE (DELTASONE) 10 MG tablet 40 mg po daily for 1 day, 30 mg po daily for 1 day, 20 mg po daily for 1 day, then home dose 10 mg po daily.  . promethazine (PHENERGAN) 25 MG tablet TAKE 1 TABLET AS NEEDED  . ranitidine (ZANTAC) 300 MG tablet TK 1 T PO QHS  . sertraline (ZOLOFT) 100 MG tablet Take 150 mg by mouth daily.   . Tiotropium Bromide Monohydrate 2.5 MCG/ACT AERS Inhale 1 capsule into the lungs daily.   . traZODone (DESYREL) 50 MG tablet TK 1 T PO QHS   No facility-administered encounter medications on file as of 07/29/2018.     PHYSICAL EXAM/ROS: reported by patient  General: Reports pain Pulmonary: no cough Abdomen: fair appetite Extremities: no edema, OA joint deformities Neurological: Weakness, alert/oriented, participates in plan of care.  Marijo File DNP, AGPCNP-BC

## 2018-07-30 ENCOUNTER — Other Ambulatory Visit: Payer: Self-pay

## 2018-07-30 ENCOUNTER — Other Ambulatory Visit: Payer: Medicare Other | Admitting: Primary Care

## 2018-07-30 DIAGNOSIS — Z515 Encounter for palliative care: Secondary | ICD-10-CM

## 2018-07-30 DIAGNOSIS — J441 Chronic obstructive pulmonary disease with (acute) exacerbation: Secondary | ICD-10-CM

## 2018-07-30 NOTE — Progress Notes (Signed)
Therap[facility], [facility] Consult Note Telephone: [phone]  Fax: [phone]  TELEHEALTH VISIT STATEMENT Due to the COVID-19 crisis, this visit was done via telemedicine from my office. It was initiated and consented to by this patient and/or family.  PATIENT NAME: Valerie EndLinda Norris DOB: 06/19/1955 MRN: [id number]  PRIMARY CARE PROVIDER:   Rosemarie Axobertson, Noelle, MD  REFERRING PROVIDER:  Rosemarie Axobertson, Noelle, MD [address] [place], [place] [address]  RESPONSIBLE PARTY:   Extended Emergency Contact Information Primary Emergency Contact: Holstine,Robert M Address: [address]          [place], [place] [address] [place] Home Phone: [phone] Mobile Phone: [phone] Relation: Spouse  Telemedicine meeting with patient L Sutherland and husband Molly MaduroRobert. ASSESSMENT and RECOMMENDATIONS:   1. Pain Control:  Needs to return to oxycodone 15 mg q 4 hrs, #180 for the month. Patient had good pain control on this and has not been able to tolerate the morphine. Patient will take unused portion back to pharmacy for destruction. Patient has had vomiting reaction to IR morphine, and is not tolerating it manifesting facial swelling with nausea and increased pain and allodynia.  No breathing distress or urticaria. Family states she may have had this happen some years ago which they now recall.  Taking gabapentin 600 mg tid in addition for neuropathic pain management. Morphine being noted in chart as an allergy.   I spent 15 minutes providing this consultation,  from 1530 to 1545. More than 50% of the time in this consultation was spent coordinating communication.   HISTORY OF PRESENT ILLNESS:  Valerie Norris is a 63 y.o. year old female with multiple medical problems including COPD, OA, Chronic pain. Palliative Care was asked to help address goals of care.   CODE STATUS: FULL   PPS: 40% HOSPICE ELIGIBILITY/DIAGNOSIS: TBD  PAST MEDICAL HISTORY:  Past Medical  History:  Diagnosis Date  . Arthritis   . Asthma   . COPD (chronic obstructive pulmonary disease) (HCC)   . GERD (gastroesophageal reflux disease)   . Hypertension     SOCIAL HX:  Social History   Tobacco Use  . Smoking status: Former Games developermoker  . Smokeless tobacco: Never Used  Substance Use Topics  . Alcohol use: No    ALLERGIES:  Allergies  Allergen Reactions  . Fentanyl     Hallucinations  . Methadone     Other reaction(s): Hallucinations  . Tetracyclines & Related Nausea And Vomiting  . Latex Rash     PERTINENT MEDICATIONS:  Outpatient Encounter Medications as of 07/30/2018  Medication Sig  . albuterol (PROVENTIL HFA;VENTOLIN HFA) 108 (90 Base) MCG/ACT inhaler two puffs every 4-6 hours as needed  . Cholecalciferol (VITAMIN D3) 2000 units capsule Take 2,000 Units by mouth daily.   . DULERA 100-5 MCG/ACT AERO Inhale 2 puffs into the lungs 2 (two) times daily.  Marland Kitchen. EVZIO 2 MG/0.4ML SOAJ Inject 1 Dose into the muscle once.  . feeding supplement, GLUCERNA SHAKE, (GLUCERNA SHAKE) LIQD Take 237 mLs by mouth 3 (three) times daily between meals. (Patient not taking: Reported on 04/24/2017)  . gabapentin (NEURONTIN) 600 MG tablet Take 600 mg by mouth QID.   Marland Kitchen. guaiFENesin-dextromethorphan (ROBITUSSIN DM) 100-10 MG/5ML syrup Take 5 mLs by mouth every 4 (four) hours as needed for cough. (Patient not taking: Reported on 04/24/2017)  . ipratropium-albuterol (DUONEB) 0.5-2.5 (3) MG/3ML SOLN Take 3 mLs by nebulization every 4 (four) hours.  . magnesium oxide (MAGOX 400) 400 (241.3  Mg) MG tablet Take 1 tablet (400 mg total) by mouth daily.  . Omega-3 1000 MG CAPS 2 (two) times daily.  Marland Kitchen omeprazole (PRILOSEC) 40 MG capsule TK 1 C PO QD.  Marland Kitchen oxybutynin (DITROPAN) 5 MG tablet Take 5 mg by mouth 2 (two) times daily.   Marland Kitchen oxyCODONE (ROXICODONE) 15 MG immediate release tablet Take 1 tablet by mouth 4 (four) times daily.  . predniSONE (DELTASONE) 10 MG tablet Take 10 mg by mouth daily.  . predniSONE  (DELTASONE) 10 MG tablet 40 mg po daily for 1 day, 30 mg po daily for 1 day, 20 mg po daily for 1 day, then home dose 10 mg po daily.  . promethazine (PHENERGAN) 25 MG tablet TAKE 1 TABLET AS NEEDED  . ranitidine (ZANTAC) 300 MG tablet TK 1 T PO QHS  . sertraline (ZOLOFT) 100 MG tablet Take 150 mg by mouth daily.   . Tiotropium Bromide Monohydrate 2.5 MCG/ACT AERS Inhale 1 capsule into the lungs daily.   . traZODone (DESYREL) 50 MG tablet TK 1 T PO QHS   No facility-administered encounter medications on file as of 07/30/2018.     PHYSICAL EXAM:   General: NAD, frail, thin Cardiovascular: no chest pain Pulmonary: prod cough, baseline DOE Abdomen: poor appetite, vomiting d/t morphine change. Extremities: no edema, peripheral neuropathy Skin: Denies urticaria Neurological: Weakness, increased pain and poor sleep due to morphine intolerance  Marijo File DNP, AGPCNP-BC

## 2018-08-07 DIAGNOSIS — J449 Chronic obstructive pulmonary disease, unspecified: Secondary | ICD-10-CM | POA: Diagnosis not present

## 2018-08-07 DIAGNOSIS — Z87891 Personal history of nicotine dependence: Secondary | ICD-10-CM | POA: Diagnosis not present

## 2018-09-07 ENCOUNTER — Telehealth: Payer: Self-pay | Admitting: Primary Care

## 2018-09-07 NOTE — Telephone Encounter (Signed)
T/c to make palliative medicine follow up appt. Message left for pt to return call.

## 2018-09-08 ENCOUNTER — Other Ambulatory Visit: Payer: Self-pay

## 2018-09-08 ENCOUNTER — Other Ambulatory Visit: Payer: Medicare HMO | Admitting: Primary Care

## 2018-09-08 DIAGNOSIS — Z515 Encounter for palliative care: Secondary | ICD-10-CM

## 2018-09-08 NOTE — Progress Notes (Signed)
Therapist, nutritional Palliative Care Consult Note Telephone: [phone]  Fax: [phone]  TELEHEALTH VISIT STATEMENT Due to the COVID-19 crisis, this visit was done via telemedicine from my office. It was initiated and consented to by this patient and/or family.  PATIENT NAME: Valerie Norris DOB: [birth year] MRN: [id number]  PRIMARY CARE PROVIDER:   Rosemarie Ax, MD  REFERRING PROVIDER:  Rosemarie Ax, MD [address] [place], [place] [address]  [phone]   RESPONSIBLE PARTY:   Extended Emergency Contact Information Primary Emergency Contact: Muela,Robert M Address: [address]          [place], [place] [address] [place] Mobile Phone: [phone] Relation: Spouse  Palliative Care was asked to follow patient by consultation request of Rosemarie Ax, MD. This is a follow up visit.  ASSESSMENT AND RECOMMENDATIONS:   1. Goals of Care: Palliative approach is goal. Please send a letter to Marene Lenz DO  at [facility], [address], [place], [address]  that PCP is now palliatively prescribing pain medications. Apparently there was a prescription for 135 tabs on file which was filled at the patient's pharmacy. Pt has been taking reduced dose again over this month with poor pain control. For good pain control and quality of life, her dose should be 15 mg oxycodone q 4 hrs. No more prescriptions are on file from Dr. Thurman Coyer, per pharmacy staff.  2. Symptom Management: Please order #180 tabs 15 mg oxycodone for pick up from [facility] in [place].  Please order for filling on  09/21/2018.  Pain medication regimen initiated by palliative medicine was well tolerated and reduced pain to 3/10, had been 10/10. Improved mood and functional ability. Apparently pt  received a prescription for reduced amount of oxycodone  from prior pain clinic ([facility]).  She now has 87 pills left which is a 2 weeks supply if going back to 6/day.  Uses [facility] on [address] in [place] discuss with patient how she will get refills (will they be sent to be on file, should she call office?)  3. Family Supports: Lives with husband who assists in her care.He is caregiver and advocate.    4. Cognitive / Functional decline:  Resume Effexor 75 mg. States better adl ability with increased pain control. Continues to endorse panic episodes, 4 recently. States she has clonazepam 0.5 mg for use up to bid. She does use these with good effect. She however stopped taking her Effexor in January 2020. We discussed advantage of antidepressant with pain control, mood and general anxiety. She agreed to start it again. She'd felt it had not done any good. Education provided that she will not have a sudden or dramatic effect but will help her over time. Pharmacy will fill Effexor 75 mg on file currently.  5. Advanced Care Directive: Mailed 5 wishes advance directive tool. Discussed advance directives with patient and her husband. They have not found their living wills since our talk last time. They have some new forms, but did have questions. I will mail a 5 wishes brochure which is a workbook outlining EOL choices. They accepted receipt of this tool and agreed to discuss medical directives on next visit.  6. Follow up Palliative Care Visit: Palliative care will continue to follow for goals of care clarification and symptom management. Return 3 weeks or prn.  I spoke to  [facility] to provide information on patient medication profile.   I spent 60 minutes providing  this consultation,  from 1400 to 1500. More than 50% of the time in this consultation was spent coordinating communication.   HISTORY OF PRESENT ILLNESS:  Valerie Norris is a 63 y.o. year old female with multiple medical problems including COPD, OA, Chronic pain. Palliative Care was asked to help address goals of care.   CODE STATUS: FULL  PPS: 40% HOSPICE ELIGIBILITY/DIAGNOSIS: TBD   PAST MEDICAL HISTORY:  Past Medical History:  Diagnosis Date  . Arthritis   . Asthma   . COPD (chronic obstructive pulmonary disease) (HCC)   . GERD (gastroesophageal reflux disease)   . Hypertension     SOCIAL HX:  Social History   Tobacco Use  . Smoking status: Former Games developer  . Smokeless tobacco: Never Used  Substance Use Topics  . Alcohol use: No    ALLERGIES:  Allergies  Allergen Reactions  . Fentanyl     Hallucinations  . Methadone     Other reaction(s): Hallucinations  . Morphine And Related   . Tetracyclines & Related Nausea And Vomiting  . Latex Rash     PERTINENT MEDICATIONS:  Outpatient Encounter Medications as of 09/08/2018  Medication Sig  . albuterol (PROVENTIL HFA;VENTOLIN HFA) 108 (90 Base) MCG/ACT inhaler two puffs every 4-6 hours as needed  . Cholecalciferol (VITAMIN D3) 2000 units capsule Take 2,000 Units by mouth daily.   . DULERA 100-5 MCG/ACT AERO Inhale 2 puffs into the lungs 2 (two) times daily.  Marland Kitchen EVZIO 2 MG/0.4ML SOAJ Inject 1 Dose into the muscle once.  . feeding supplement, GLUCERNA SHAKE, (GLUCERNA SHAKE) LIQD Take 237 mLs by mouth 3 (three) times daily between meals. (Patient not taking: Reported on 04/24/2017)  . gabapentin (NEURONTIN) 600 MG tablet Take 600 mg by mouth QID.   Marland Kitchen guaiFENesin-dextromethorphan (ROBITUSSIN DM) 100-10 MG/5ML syrup Take 5 mLs by mouth every 4 (four) hours as needed for cough. (Patient not taking: Reported on 04/24/2017)  . ipratropium-albuterol (DUONEB) 0.5-2.5 (3) MG/3ML SOLN Take 3 mLs by nebulization every 4 (four) hours.  . magnesium oxide (MAGOX 400) 400 (241.3 Mg) MG tablet Take 1 tablet (400 mg total) by mouth daily.  . Omega-3 1000 MG CAPS 2 (two) times daily.  Marland Kitchen omeprazole (PRILOSEC) 40 MG capsule TK 1 C PO QD.  Marland Kitchen oxybutynin (DITROPAN) 5 MG tablet Take 5 mg by mouth 2 (two) times daily.   Marland Kitchen oxyCODONE (ROXICODONE) 15 MG immediate release tablet Take 1 tablet by mouth 4 (four) times daily.  . predniSONE  (DELTASONE) 10 MG tablet Take 10 mg by mouth daily.  . predniSONE (DELTASONE) 10 MG tablet 40 mg po daily for 1 day, 30 mg po daily for 1 day, 20 mg po daily for 1 day, then home dose 10 mg po daily.  . promethazine (PHENERGAN) 25 MG tablet TAKE 1 TABLET AS NEEDED  . ranitidine (ZANTAC) 300 MG tablet TK 1 T PO QHS  . sertraline (ZOLOFT) 100 MG tablet Take 150 mg by mouth daily.   . Tiotropium Bromide Monohydrate 2.5 MCG/ACT AERS Inhale 1 capsule into the lungs daily.   . traZODone (DESYREL) 50 MG tablet TK 1 T PO QHS   No facility-administered encounter medications on file as of 09/08/2018.     PHYSICAL EXAM/ROS:   Does not have recent wt, last wt is 114 lb. General: NAD, frail,  thin Cardiovascular: no chest pain reported, occ. pedal edema Intermittent Pulmonary: + cough,  increased SOB, feels it's anxiety related.  Abdomen: appetite fair, denies constipation GU: denies dysuria  MSK:  arthritic joint deformities, uses cane in home. Skin: no rashes or wounds reported Neurological: Weakness, but otherwise nonfocal, reports panic attacks, 4 recent. States she has clonazepam. Denies being on Zoloft now,was on Effexor but stopped for reason she cannot recall, perhaps not feeling it was helping.  Marijo FileKathryn M Orchid Glassberg DNP, AGPCNP-BC

## 2018-09-18 DIAGNOSIS — R1112 Projectile vomiting: Secondary | ICD-10-CM | POA: Diagnosis not present

## 2018-09-18 DIAGNOSIS — Z515 Encounter for palliative care: Secondary | ICD-10-CM | POA: Diagnosis not present

## 2018-09-18 DIAGNOSIS — I5020 Unspecified systolic (congestive) heart failure: Secondary | ICD-10-CM | POA: Diagnosis not present

## 2018-09-18 DIAGNOSIS — J9621 Acute and chronic respiratory failure with hypoxia: Secondary | ICD-10-CM | POA: Diagnosis not present

## 2018-09-18 DIAGNOSIS — J440 Chronic obstructive pulmonary disease with acute lower respiratory infection: Secondary | ICD-10-CM | POA: Diagnosis present

## 2018-09-18 DIAGNOSIS — Z87891 Personal history of nicotine dependence: Secondary | ICD-10-CM | POA: Diagnosis not present

## 2018-09-18 DIAGNOSIS — R509 Fever, unspecified: Secondary | ICD-10-CM | POA: Diagnosis not present

## 2018-09-18 DIAGNOSIS — K219 Gastro-esophageal reflux disease without esophagitis: Secondary | ICD-10-CM | POA: Diagnosis present

## 2018-09-18 DIAGNOSIS — Z9981 Dependence on supplemental oxygen: Secondary | ICD-10-CM | POA: Diagnosis not present

## 2018-09-18 DIAGNOSIS — R1084 Generalized abdominal pain: Secondary | ICD-10-CM | POA: Diagnosis not present

## 2018-09-18 DIAGNOSIS — I499 Cardiac arrhythmia, unspecified: Secondary | ICD-10-CM | POA: Diagnosis not present

## 2018-09-18 DIAGNOSIS — I083 Combined rheumatic disorders of mitral, aortic and tricuspid valves: Secondary | ICD-10-CM | POA: Diagnosis present

## 2018-09-18 DIAGNOSIS — J189 Pneumonia, unspecified organism: Secondary | ICD-10-CM | POA: Diagnosis not present

## 2018-09-18 DIAGNOSIS — I358 Other nonrheumatic aortic valve disorders: Secondary | ICD-10-CM | POA: Diagnosis not present

## 2018-09-18 DIAGNOSIS — F419 Anxiety disorder, unspecified: Secondary | ICD-10-CM | POA: Diagnosis present

## 2018-09-18 DIAGNOSIS — R0902 Hypoxemia: Secondary | ICD-10-CM | POA: Diagnosis not present

## 2018-09-18 DIAGNOSIS — R1013 Epigastric pain: Secondary | ICD-10-CM | POA: Diagnosis not present

## 2018-09-18 DIAGNOSIS — Z881 Allergy status to other antibiotic agents status: Secondary | ICD-10-CM | POA: Diagnosis not present

## 2018-09-18 DIAGNOSIS — I4891 Unspecified atrial fibrillation: Secondary | ICD-10-CM | POA: Diagnosis not present

## 2018-09-18 DIAGNOSIS — R0602 Shortness of breath: Secondary | ICD-10-CM | POA: Diagnosis not present

## 2018-09-18 DIAGNOSIS — Z20828 Contact with and (suspected) exposure to other viral communicable diseases: Secondary | ICD-10-CM | POA: Diagnosis not present

## 2018-09-18 DIAGNOSIS — J9622 Acute and chronic respiratory failure with hypercapnia: Secondary | ICD-10-CM | POA: Diagnosis not present

## 2018-09-18 DIAGNOSIS — G8929 Other chronic pain: Secondary | ICD-10-CM | POA: Diagnosis not present

## 2018-09-18 DIAGNOSIS — I340 Nonrheumatic mitral (valve) insufficiency: Secondary | ICD-10-CM | POA: Diagnosis not present

## 2018-09-18 DIAGNOSIS — J9612 Chronic respiratory failure with hypercapnia: Secondary | ICD-10-CM | POA: Diagnosis not present

## 2018-09-18 DIAGNOSIS — I214 Non-ST elevation (NSTEMI) myocardial infarction: Secondary | ICD-10-CM | POA: Diagnosis not present

## 2018-09-18 DIAGNOSIS — Z66 Do not resuscitate: Secondary | ICD-10-CM | POA: Diagnosis present

## 2018-09-18 DIAGNOSIS — F329 Major depressive disorder, single episode, unspecified: Secondary | ICD-10-CM | POA: Diagnosis present

## 2018-09-18 DIAGNOSIS — M810 Age-related osteoporosis without current pathological fracture: Secondary | ICD-10-CM | POA: Diagnosis present

## 2018-09-18 DIAGNOSIS — I361 Nonrheumatic tricuspid (valve) insufficiency: Secondary | ICD-10-CM | POA: Diagnosis not present

## 2018-09-18 DIAGNOSIS — R000 Tachycardia, unspecified: Secondary | ICD-10-CM | POA: Diagnosis not present

## 2018-09-18 DIAGNOSIS — J984 Other disorders of lung: Secondary | ICD-10-CM | POA: Diagnosis not present

## 2018-09-18 DIAGNOSIS — J449 Chronic obstructive pulmonary disease, unspecified: Secondary | ICD-10-CM | POA: Diagnosis not present

## 2018-09-18 DIAGNOSIS — R7989 Other specified abnormal findings of blood chemistry: Secondary | ICD-10-CM | POA: Diagnosis not present

## 2018-09-18 DIAGNOSIS — Z91040 Latex allergy status: Secondary | ICD-10-CM | POA: Diagnosis not present

## 2018-09-18 DIAGNOSIS — R0789 Other chest pain: Secondary | ICD-10-CM | POA: Diagnosis not present

## 2018-09-18 DIAGNOSIS — Z7951 Long term (current) use of inhaled steroids: Secondary | ICD-10-CM | POA: Diagnosis not present

## 2018-09-18 DIAGNOSIS — J9601 Acute respiratory failure with hypoxia: Secondary | ICD-10-CM | POA: Diagnosis not present

## 2018-09-18 DIAGNOSIS — G894 Chronic pain syndrome: Secondary | ICD-10-CM | POA: Diagnosis present

## 2018-09-18 DIAGNOSIS — I110 Hypertensive heart disease with heart failure: Secondary | ICD-10-CM | POA: Diagnosis present

## 2018-09-18 DIAGNOSIS — I5022 Chronic systolic (congestive) heart failure: Secondary | ICD-10-CM | POA: Diagnosis present

## 2018-09-18 DIAGNOSIS — M549 Dorsalgia, unspecified: Secondary | ICD-10-CM | POA: Diagnosis not present

## 2018-09-18 DIAGNOSIS — H9190 Unspecified hearing loss, unspecified ear: Secondary | ICD-10-CM | POA: Diagnosis present

## 2018-09-18 DIAGNOSIS — J9602 Acute respiratory failure with hypercapnia: Secondary | ICD-10-CM | POA: Diagnosis not present

## 2018-09-18 DIAGNOSIS — Z885 Allergy status to narcotic agent status: Secondary | ICD-10-CM | POA: Diagnosis not present

## 2018-09-22 ENCOUNTER — Telehealth: Payer: Self-pay | Admitting: Primary Care

## 2018-09-22 NOTE — Telephone Encounter (Signed)
Discussed case with PCP who will discuss with patient on follow up visit on Thursday.

## 2018-09-24 DIAGNOSIS — N3281 Overactive bladder: Secondary | ICD-10-CM | POA: Diagnosis not present

## 2018-09-24 DIAGNOSIS — J449 Chronic obstructive pulmonary disease, unspecified: Secondary | ICD-10-CM | POA: Diagnosis not present

## 2018-09-24 DIAGNOSIS — I214 Non-ST elevation (NSTEMI) myocardial infarction: Secondary | ICD-10-CM | POA: Diagnosis not present

## 2018-09-24 DIAGNOSIS — Z515 Encounter for palliative care: Secondary | ICD-10-CM | POA: Diagnosis not present

## 2018-09-24 DIAGNOSIS — I5020 Unspecified systolic (congestive) heart failure: Secondary | ICD-10-CM | POA: Diagnosis not present

## 2018-09-24 DIAGNOSIS — Z79899 Other long term (current) drug therapy: Secondary | ICD-10-CM | POA: Diagnosis not present

## 2018-09-25 DIAGNOSIS — J449 Chronic obstructive pulmonary disease, unspecified: Secondary | ICD-10-CM | POA: Diagnosis not present

## 2018-09-25 DIAGNOSIS — I10 Essential (primary) hypertension: Secondary | ICD-10-CM | POA: Diagnosis not present

## 2018-09-25 DIAGNOSIS — J189 Pneumonia, unspecified organism: Secondary | ICD-10-CM | POA: Diagnosis not present

## 2018-09-25 DIAGNOSIS — E785 Hyperlipidemia, unspecified: Secondary | ICD-10-CM | POA: Diagnosis not present

## 2018-09-25 DIAGNOSIS — M810 Age-related osteoporosis without current pathological fracture: Secondary | ICD-10-CM | POA: Diagnosis not present

## 2018-09-25 DIAGNOSIS — M419 Scoliosis, unspecified: Secondary | ICD-10-CM | POA: Diagnosis not present

## 2018-09-25 DIAGNOSIS — Z9989 Dependence on other enabling machines and devices: Secondary | ICD-10-CM | POA: Diagnosis not present

## 2018-09-25 DIAGNOSIS — F329 Major depressive disorder, single episode, unspecified: Secondary | ICD-10-CM | POA: Diagnosis not present

## 2018-09-25 DIAGNOSIS — Z9981 Dependence on supplemental oxygen: Secondary | ICD-10-CM | POA: Diagnosis not present

## 2018-09-25 DIAGNOSIS — J9612 Chronic respiratory failure with hypercapnia: Secondary | ICD-10-CM | POA: Diagnosis not present

## 2018-09-25 DIAGNOSIS — M1990 Unspecified osteoarthritis, unspecified site: Secondary | ICD-10-CM | POA: Diagnosis not present

## 2018-09-25 DIAGNOSIS — M549 Dorsalgia, unspecified: Secondary | ICD-10-CM | POA: Diagnosis not present

## 2018-09-25 DIAGNOSIS — J9611 Chronic respiratory failure with hypoxia: Secondary | ICD-10-CM | POA: Diagnosis not present

## 2018-09-25 DIAGNOSIS — N393 Stress incontinence (female) (male): Secondary | ICD-10-CM | POA: Diagnosis not present

## 2018-09-25 DIAGNOSIS — F419 Anxiety disorder, unspecified: Secondary | ICD-10-CM | POA: Diagnosis not present

## 2018-09-25 DIAGNOSIS — Z87442 Personal history of urinary calculi: Secondary | ICD-10-CM | POA: Diagnosis not present

## 2018-09-25 DIAGNOSIS — K219 Gastro-esophageal reflux disease without esophagitis: Secondary | ICD-10-CM | POA: Diagnosis not present

## 2018-09-28 DIAGNOSIS — J449 Chronic obstructive pulmonary disease, unspecified: Secondary | ICD-10-CM | POA: Diagnosis not present

## 2018-09-28 DIAGNOSIS — M419 Scoliosis, unspecified: Secondary | ICD-10-CM | POA: Diagnosis not present

## 2018-09-28 DIAGNOSIS — J9611 Chronic respiratory failure with hypoxia: Secondary | ICD-10-CM | POA: Diagnosis not present

## 2018-09-28 DIAGNOSIS — E785 Hyperlipidemia, unspecified: Secondary | ICD-10-CM | POA: Diagnosis not present

## 2018-09-28 DIAGNOSIS — I10 Essential (primary) hypertension: Secondary | ICD-10-CM | POA: Diagnosis not present

## 2018-09-28 DIAGNOSIS — J9612 Chronic respiratory failure with hypercapnia: Secondary | ICD-10-CM | POA: Diagnosis not present

## 2018-10-01 DIAGNOSIS — J449 Chronic obstructive pulmonary disease, unspecified: Secondary | ICD-10-CM | POA: Diagnosis not present

## 2018-10-01 DIAGNOSIS — I10 Essential (primary) hypertension: Secondary | ICD-10-CM | POA: Diagnosis not present

## 2018-10-01 DIAGNOSIS — J9611 Chronic respiratory failure with hypoxia: Secondary | ICD-10-CM | POA: Diagnosis not present

## 2018-10-01 DIAGNOSIS — J9612 Chronic respiratory failure with hypercapnia: Secondary | ICD-10-CM | POA: Diagnosis not present

## 2018-10-01 DIAGNOSIS — M419 Scoliosis, unspecified: Secondary | ICD-10-CM | POA: Diagnosis not present

## 2018-10-01 DIAGNOSIS — E785 Hyperlipidemia, unspecified: Secondary | ICD-10-CM | POA: Diagnosis not present

## 2018-10-07 DIAGNOSIS — Z9981 Dependence on supplemental oxygen: Secondary | ICD-10-CM | POA: Diagnosis not present

## 2018-10-07 DIAGNOSIS — F329 Major depressive disorder, single episode, unspecified: Secondary | ICD-10-CM | POA: Diagnosis not present

## 2018-10-07 DIAGNOSIS — M549 Dorsalgia, unspecified: Secondary | ICD-10-CM | POA: Diagnosis not present

## 2018-10-07 DIAGNOSIS — J9612 Chronic respiratory failure with hypercapnia: Secondary | ICD-10-CM | POA: Diagnosis not present

## 2018-10-07 DIAGNOSIS — M419 Scoliosis, unspecified: Secondary | ICD-10-CM | POA: Diagnosis not present

## 2018-10-07 DIAGNOSIS — Z87442 Personal history of urinary calculi: Secondary | ICD-10-CM | POA: Diagnosis not present

## 2018-10-07 DIAGNOSIS — M810 Age-related osteoporosis without current pathological fracture: Secondary | ICD-10-CM | POA: Diagnosis not present

## 2018-10-07 DIAGNOSIS — Z9989 Dependence on other enabling machines and devices: Secondary | ICD-10-CM | POA: Diagnosis not present

## 2018-10-07 DIAGNOSIS — J9611 Chronic respiratory failure with hypoxia: Secondary | ICD-10-CM | POA: Diagnosis not present

## 2018-10-07 DIAGNOSIS — J189 Pneumonia, unspecified organism: Secondary | ICD-10-CM | POA: Diagnosis not present

## 2018-10-07 DIAGNOSIS — E785 Hyperlipidemia, unspecified: Secondary | ICD-10-CM | POA: Diagnosis not present

## 2018-10-07 DIAGNOSIS — J449 Chronic obstructive pulmonary disease, unspecified: Secondary | ICD-10-CM | POA: Diagnosis not present

## 2018-10-07 DIAGNOSIS — M1990 Unspecified osteoarthritis, unspecified site: Secondary | ICD-10-CM | POA: Diagnosis not present

## 2018-10-07 DIAGNOSIS — K219 Gastro-esophageal reflux disease without esophagitis: Secondary | ICD-10-CM | POA: Diagnosis not present

## 2018-10-07 DIAGNOSIS — N393 Stress incontinence (female) (male): Secondary | ICD-10-CM | POA: Diagnosis not present

## 2018-10-07 DIAGNOSIS — I10 Essential (primary) hypertension: Secondary | ICD-10-CM | POA: Diagnosis not present

## 2018-10-07 DIAGNOSIS — F419 Anxiety disorder, unspecified: Secondary | ICD-10-CM | POA: Diagnosis not present

## 2018-10-09 DIAGNOSIS — M419 Scoliosis, unspecified: Secondary | ICD-10-CM | POA: Diagnosis not present

## 2018-10-09 DIAGNOSIS — J449 Chronic obstructive pulmonary disease, unspecified: Secondary | ICD-10-CM | POA: Diagnosis not present

## 2018-10-09 DIAGNOSIS — I10 Essential (primary) hypertension: Secondary | ICD-10-CM | POA: Diagnosis not present

## 2018-10-09 DIAGNOSIS — J9611 Chronic respiratory failure with hypoxia: Secondary | ICD-10-CM | POA: Diagnosis not present

## 2018-10-09 DIAGNOSIS — J9612 Chronic respiratory failure with hypercapnia: Secondary | ICD-10-CM | POA: Diagnosis not present

## 2018-10-09 DIAGNOSIS — E785 Hyperlipidemia, unspecified: Secondary | ICD-10-CM | POA: Diagnosis not present

## 2018-10-12 DIAGNOSIS — J9612 Chronic respiratory failure with hypercapnia: Secondary | ICD-10-CM | POA: Diagnosis not present

## 2018-10-12 DIAGNOSIS — J9611 Chronic respiratory failure with hypoxia: Secondary | ICD-10-CM | POA: Diagnosis not present

## 2018-10-12 DIAGNOSIS — J449 Chronic obstructive pulmonary disease, unspecified: Secondary | ICD-10-CM | POA: Diagnosis not present

## 2018-10-12 DIAGNOSIS — E785 Hyperlipidemia, unspecified: Secondary | ICD-10-CM | POA: Diagnosis not present

## 2018-10-12 DIAGNOSIS — I10 Essential (primary) hypertension: Secondary | ICD-10-CM | POA: Diagnosis not present

## 2018-10-12 DIAGNOSIS — M419 Scoliosis, unspecified: Secondary | ICD-10-CM | POA: Diagnosis not present

## 2018-10-23 DIAGNOSIS — E785 Hyperlipidemia, unspecified: Secondary | ICD-10-CM | POA: Diagnosis not present

## 2018-10-23 DIAGNOSIS — J9611 Chronic respiratory failure with hypoxia: Secondary | ICD-10-CM | POA: Diagnosis not present

## 2018-10-23 DIAGNOSIS — J9612 Chronic respiratory failure with hypercapnia: Secondary | ICD-10-CM | POA: Diagnosis not present

## 2018-10-23 DIAGNOSIS — M419 Scoliosis, unspecified: Secondary | ICD-10-CM | POA: Diagnosis not present

## 2018-10-23 DIAGNOSIS — J449 Chronic obstructive pulmonary disease, unspecified: Secondary | ICD-10-CM | POA: Diagnosis not present

## 2018-10-23 DIAGNOSIS — I10 Essential (primary) hypertension: Secondary | ICD-10-CM | POA: Diagnosis not present

## 2018-11-06 DIAGNOSIS — J449 Chronic obstructive pulmonary disease, unspecified: Secondary | ICD-10-CM | POA: Diagnosis not present

## 2018-11-06 DIAGNOSIS — E785 Hyperlipidemia, unspecified: Secondary | ICD-10-CM | POA: Diagnosis not present

## 2018-11-06 DIAGNOSIS — I10 Essential (primary) hypertension: Secondary | ICD-10-CM | POA: Diagnosis not present

## 2018-11-06 DIAGNOSIS — J9612 Chronic respiratory failure with hypercapnia: Secondary | ICD-10-CM | POA: Diagnosis not present

## 2018-11-06 DIAGNOSIS — J9611 Chronic respiratory failure with hypoxia: Secondary | ICD-10-CM | POA: Diagnosis not present

## 2018-11-06 DIAGNOSIS — M419 Scoliosis, unspecified: Secondary | ICD-10-CM | POA: Diagnosis not present

## 2018-11-07 DIAGNOSIS — Z87442 Personal history of urinary calculi: Secondary | ICD-10-CM | POA: Diagnosis not present

## 2018-11-07 DIAGNOSIS — J189 Pneumonia, unspecified organism: Secondary | ICD-10-CM | POA: Diagnosis not present

## 2018-11-07 DIAGNOSIS — M810 Age-related osteoporosis without current pathological fracture: Secondary | ICD-10-CM | POA: Diagnosis not present

## 2018-11-07 DIAGNOSIS — I10 Essential (primary) hypertension: Secondary | ICD-10-CM | POA: Diagnosis not present

## 2018-11-07 DIAGNOSIS — M419 Scoliosis, unspecified: Secondary | ICD-10-CM | POA: Diagnosis not present

## 2018-11-07 DIAGNOSIS — F419 Anxiety disorder, unspecified: Secondary | ICD-10-CM | POA: Diagnosis not present

## 2018-11-07 DIAGNOSIS — M1990 Unspecified osteoarthritis, unspecified site: Secondary | ICD-10-CM | POA: Diagnosis not present

## 2018-11-07 DIAGNOSIS — N393 Stress incontinence (female) (male): Secondary | ICD-10-CM | POA: Diagnosis not present

## 2018-11-07 DIAGNOSIS — Z9989 Dependence on other enabling machines and devices: Secondary | ICD-10-CM | POA: Diagnosis not present

## 2018-11-07 DIAGNOSIS — Z9981 Dependence on supplemental oxygen: Secondary | ICD-10-CM | POA: Diagnosis not present

## 2018-11-07 DIAGNOSIS — J449 Chronic obstructive pulmonary disease, unspecified: Secondary | ICD-10-CM | POA: Diagnosis not present

## 2018-11-07 DIAGNOSIS — F329 Major depressive disorder, single episode, unspecified: Secondary | ICD-10-CM | POA: Diagnosis not present

## 2018-11-07 DIAGNOSIS — M549 Dorsalgia, unspecified: Secondary | ICD-10-CM | POA: Diagnosis not present

## 2018-11-07 DIAGNOSIS — E785 Hyperlipidemia, unspecified: Secondary | ICD-10-CM | POA: Diagnosis not present

## 2018-11-07 DIAGNOSIS — J9612 Chronic respiratory failure with hypercapnia: Secondary | ICD-10-CM | POA: Diagnosis not present

## 2018-11-07 DIAGNOSIS — K219 Gastro-esophageal reflux disease without esophagitis: Secondary | ICD-10-CM | POA: Diagnosis not present

## 2018-11-07 DIAGNOSIS — J9611 Chronic respiratory failure with hypoxia: Secondary | ICD-10-CM | POA: Diagnosis not present

## 2018-11-12 DIAGNOSIS — E785 Hyperlipidemia, unspecified: Secondary | ICD-10-CM | POA: Diagnosis not present

## 2018-11-12 DIAGNOSIS — M419 Scoliosis, unspecified: Secondary | ICD-10-CM | POA: Diagnosis not present

## 2018-11-12 DIAGNOSIS — I10 Essential (primary) hypertension: Secondary | ICD-10-CM | POA: Diagnosis not present

## 2018-11-12 DIAGNOSIS — J9611 Chronic respiratory failure with hypoxia: Secondary | ICD-10-CM | POA: Diagnosis not present

## 2018-11-12 DIAGNOSIS — J9612 Chronic respiratory failure with hypercapnia: Secondary | ICD-10-CM | POA: Diagnosis not present

## 2018-11-12 DIAGNOSIS — J449 Chronic obstructive pulmonary disease, unspecified: Secondary | ICD-10-CM | POA: Diagnosis not present

## 2018-11-17 DIAGNOSIS — I10 Essential (primary) hypertension: Secondary | ICD-10-CM | POA: Diagnosis not present

## 2018-11-17 DIAGNOSIS — J9611 Chronic respiratory failure with hypoxia: Secondary | ICD-10-CM | POA: Diagnosis not present

## 2018-11-17 DIAGNOSIS — E785 Hyperlipidemia, unspecified: Secondary | ICD-10-CM | POA: Diagnosis not present

## 2018-11-17 DIAGNOSIS — J9612 Chronic respiratory failure with hypercapnia: Secondary | ICD-10-CM | POA: Diagnosis not present

## 2018-11-17 DIAGNOSIS — M419 Scoliosis, unspecified: Secondary | ICD-10-CM | POA: Diagnosis not present

## 2018-11-17 DIAGNOSIS — J449 Chronic obstructive pulmonary disease, unspecified: Secondary | ICD-10-CM | POA: Diagnosis not present

## 2018-11-18 DIAGNOSIS — J9612 Chronic respiratory failure with hypercapnia: Secondary | ICD-10-CM | POA: Diagnosis not present

## 2018-11-18 DIAGNOSIS — I10 Essential (primary) hypertension: Secondary | ICD-10-CM | POA: Diagnosis not present

## 2018-11-18 DIAGNOSIS — J449 Chronic obstructive pulmonary disease, unspecified: Secondary | ICD-10-CM | POA: Diagnosis not present

## 2018-11-18 DIAGNOSIS — Z87891 Personal history of nicotine dependence: Secondary | ICD-10-CM | POA: Diagnosis not present

## 2018-11-18 DIAGNOSIS — E785 Hyperlipidemia, unspecified: Secondary | ICD-10-CM | POA: Diagnosis not present

## 2018-11-18 DIAGNOSIS — M419 Scoliosis, unspecified: Secondary | ICD-10-CM | POA: Diagnosis not present

## 2018-11-18 DIAGNOSIS — N189 Chronic kidney disease, unspecified: Secondary | ICD-10-CM | POA: Diagnosis not present

## 2018-11-18 DIAGNOSIS — J9611 Chronic respiratory failure with hypoxia: Secondary | ICD-10-CM | POA: Diagnosis not present

## 2018-11-19 DIAGNOSIS — Z515 Encounter for palliative care: Secondary | ICD-10-CM | POA: Diagnosis not present

## 2018-11-19 DIAGNOSIS — F419 Anxiety disorder, unspecified: Secondary | ICD-10-CM | POA: Diagnosis not present

## 2018-11-19 DIAGNOSIS — J449 Chronic obstructive pulmonary disease, unspecified: Secondary | ICD-10-CM | POA: Diagnosis not present

## 2018-11-19 DIAGNOSIS — G4700 Insomnia, unspecified: Secondary | ICD-10-CM | POA: Diagnosis not present

## 2018-11-19 DIAGNOSIS — R252 Cramp and spasm: Secondary | ICD-10-CM | POA: Diagnosis not present

## 2018-11-27 DIAGNOSIS — J9611 Chronic respiratory failure with hypoxia: Secondary | ICD-10-CM | POA: Diagnosis not present

## 2018-11-27 DIAGNOSIS — J449 Chronic obstructive pulmonary disease, unspecified: Secondary | ICD-10-CM | POA: Diagnosis not present

## 2018-11-27 DIAGNOSIS — E785 Hyperlipidemia, unspecified: Secondary | ICD-10-CM | POA: Diagnosis not present

## 2018-11-27 DIAGNOSIS — M419 Scoliosis, unspecified: Secondary | ICD-10-CM | POA: Diagnosis not present

## 2018-11-27 DIAGNOSIS — I10 Essential (primary) hypertension: Secondary | ICD-10-CM | POA: Diagnosis not present

## 2018-11-27 DIAGNOSIS — J9612 Chronic respiratory failure with hypercapnia: Secondary | ICD-10-CM | POA: Diagnosis not present

## 2018-12-08 DIAGNOSIS — Z9981 Dependence on supplemental oxygen: Secondary | ICD-10-CM | POA: Diagnosis not present

## 2018-12-08 DIAGNOSIS — M419 Scoliosis, unspecified: Secondary | ICD-10-CM | POA: Diagnosis not present

## 2018-12-08 DIAGNOSIS — J9611 Chronic respiratory failure with hypoxia: Secondary | ICD-10-CM | POA: Diagnosis not present

## 2018-12-08 DIAGNOSIS — Z87442 Personal history of urinary calculi: Secondary | ICD-10-CM | POA: Diagnosis not present

## 2018-12-08 DIAGNOSIS — J9612 Chronic respiratory failure with hypercapnia: Secondary | ICD-10-CM | POA: Diagnosis not present

## 2018-12-08 DIAGNOSIS — K219 Gastro-esophageal reflux disease without esophagitis: Secondary | ICD-10-CM | POA: Diagnosis not present

## 2018-12-08 DIAGNOSIS — M810 Age-related osteoporosis without current pathological fracture: Secondary | ICD-10-CM | POA: Diagnosis not present

## 2018-12-08 DIAGNOSIS — F419 Anxiety disorder, unspecified: Secondary | ICD-10-CM | POA: Diagnosis not present

## 2018-12-08 DIAGNOSIS — E785 Hyperlipidemia, unspecified: Secondary | ICD-10-CM | POA: Diagnosis not present

## 2018-12-08 DIAGNOSIS — Z9989 Dependence on other enabling machines and devices: Secondary | ICD-10-CM | POA: Diagnosis not present

## 2018-12-08 DIAGNOSIS — M549 Dorsalgia, unspecified: Secondary | ICD-10-CM | POA: Diagnosis not present

## 2018-12-08 DIAGNOSIS — I10 Essential (primary) hypertension: Secondary | ICD-10-CM | POA: Diagnosis not present

## 2018-12-08 DIAGNOSIS — F329 Major depressive disorder, single episode, unspecified: Secondary | ICD-10-CM | POA: Diagnosis not present

## 2018-12-08 DIAGNOSIS — J449 Chronic obstructive pulmonary disease, unspecified: Secondary | ICD-10-CM | POA: Diagnosis not present

## 2018-12-08 DIAGNOSIS — J189 Pneumonia, unspecified organism: Secondary | ICD-10-CM | POA: Diagnosis not present

## 2018-12-08 DIAGNOSIS — M1990 Unspecified osteoarthritis, unspecified site: Secondary | ICD-10-CM | POA: Diagnosis not present

## 2018-12-08 DIAGNOSIS — N393 Stress incontinence (female) (male): Secondary | ICD-10-CM | POA: Diagnosis not present

## 2018-12-10 DIAGNOSIS — I10 Essential (primary) hypertension: Secondary | ICD-10-CM | POA: Diagnosis not present

## 2018-12-10 DIAGNOSIS — J449 Chronic obstructive pulmonary disease, unspecified: Secondary | ICD-10-CM | POA: Diagnosis not present

## 2018-12-10 DIAGNOSIS — J9612 Chronic respiratory failure with hypercapnia: Secondary | ICD-10-CM | POA: Diagnosis not present

## 2018-12-10 DIAGNOSIS — J9611 Chronic respiratory failure with hypoxia: Secondary | ICD-10-CM | POA: Diagnosis not present

## 2018-12-10 DIAGNOSIS — E785 Hyperlipidemia, unspecified: Secondary | ICD-10-CM | POA: Diagnosis not present

## 2018-12-10 DIAGNOSIS — M419 Scoliosis, unspecified: Secondary | ICD-10-CM | POA: Diagnosis not present

## 2018-12-11 DIAGNOSIS — J9611 Chronic respiratory failure with hypoxia: Secondary | ICD-10-CM | POA: Diagnosis not present

## 2018-12-11 DIAGNOSIS — I10 Essential (primary) hypertension: Secondary | ICD-10-CM | POA: Diagnosis not present

## 2018-12-11 DIAGNOSIS — M419 Scoliosis, unspecified: Secondary | ICD-10-CM | POA: Diagnosis not present

## 2018-12-11 DIAGNOSIS — E785 Hyperlipidemia, unspecified: Secondary | ICD-10-CM | POA: Diagnosis not present

## 2018-12-11 DIAGNOSIS — J9612 Chronic respiratory failure with hypercapnia: Secondary | ICD-10-CM | POA: Diagnosis not present

## 2018-12-11 DIAGNOSIS — J449 Chronic obstructive pulmonary disease, unspecified: Secondary | ICD-10-CM | POA: Diagnosis not present

## 2018-12-17 DIAGNOSIS — M419 Scoliosis, unspecified: Secondary | ICD-10-CM | POA: Diagnosis not present

## 2018-12-17 DIAGNOSIS — I10 Essential (primary) hypertension: Secondary | ICD-10-CM | POA: Diagnosis not present

## 2018-12-17 DIAGNOSIS — E785 Hyperlipidemia, unspecified: Secondary | ICD-10-CM | POA: Diagnosis not present

## 2018-12-17 DIAGNOSIS — J9612 Chronic respiratory failure with hypercapnia: Secondary | ICD-10-CM | POA: Diagnosis not present

## 2018-12-17 DIAGNOSIS — J449 Chronic obstructive pulmonary disease, unspecified: Secondary | ICD-10-CM | POA: Diagnosis not present

## 2018-12-17 DIAGNOSIS — J9611 Chronic respiratory failure with hypoxia: Secondary | ICD-10-CM | POA: Diagnosis not present

## 2018-12-25 DIAGNOSIS — E785 Hyperlipidemia, unspecified: Secondary | ICD-10-CM | POA: Diagnosis not present

## 2018-12-25 DIAGNOSIS — I10 Essential (primary) hypertension: Secondary | ICD-10-CM | POA: Diagnosis not present

## 2018-12-25 DIAGNOSIS — J9612 Chronic respiratory failure with hypercapnia: Secondary | ICD-10-CM | POA: Diagnosis not present

## 2018-12-25 DIAGNOSIS — J9611 Chronic respiratory failure with hypoxia: Secondary | ICD-10-CM | POA: Diagnosis not present

## 2018-12-25 DIAGNOSIS — J449 Chronic obstructive pulmonary disease, unspecified: Secondary | ICD-10-CM | POA: Diagnosis not present

## 2018-12-25 DIAGNOSIS — M419 Scoliosis, unspecified: Secondary | ICD-10-CM | POA: Diagnosis not present

## 2018-12-31 DIAGNOSIS — J9611 Chronic respiratory failure with hypoxia: Secondary | ICD-10-CM | POA: Diagnosis not present

## 2018-12-31 DIAGNOSIS — E785 Hyperlipidemia, unspecified: Secondary | ICD-10-CM | POA: Diagnosis not present

## 2018-12-31 DIAGNOSIS — I10 Essential (primary) hypertension: Secondary | ICD-10-CM | POA: Diagnosis not present

## 2018-12-31 DIAGNOSIS — J449 Chronic obstructive pulmonary disease, unspecified: Secondary | ICD-10-CM | POA: Diagnosis not present

## 2018-12-31 DIAGNOSIS — M419 Scoliosis, unspecified: Secondary | ICD-10-CM | POA: Diagnosis not present

## 2018-12-31 DIAGNOSIS — J9612 Chronic respiratory failure with hypercapnia: Secondary | ICD-10-CM | POA: Diagnosis not present

## 2019-01-07 DIAGNOSIS — Z9989 Dependence on other enabling machines and devices: Secondary | ICD-10-CM | POA: Diagnosis not present

## 2019-01-07 DIAGNOSIS — J189 Pneumonia, unspecified organism: Secondary | ICD-10-CM | POA: Diagnosis not present

## 2019-01-07 DIAGNOSIS — E785 Hyperlipidemia, unspecified: Secondary | ICD-10-CM | POA: Diagnosis not present

## 2019-01-07 DIAGNOSIS — I10 Essential (primary) hypertension: Secondary | ICD-10-CM | POA: Diagnosis not present

## 2019-01-07 DIAGNOSIS — Z87442 Personal history of urinary calculi: Secondary | ICD-10-CM | POA: Diagnosis not present

## 2019-01-07 DIAGNOSIS — M810 Age-related osteoporosis without current pathological fracture: Secondary | ICD-10-CM | POA: Diagnosis not present

## 2019-01-07 DIAGNOSIS — J9611 Chronic respiratory failure with hypoxia: Secondary | ICD-10-CM | POA: Diagnosis not present

## 2019-01-07 DIAGNOSIS — M1990 Unspecified osteoarthritis, unspecified site: Secondary | ICD-10-CM | POA: Diagnosis not present

## 2019-01-07 DIAGNOSIS — M549 Dorsalgia, unspecified: Secondary | ICD-10-CM | POA: Diagnosis not present

## 2019-01-07 DIAGNOSIS — M419 Scoliosis, unspecified: Secondary | ICD-10-CM | POA: Diagnosis not present

## 2019-01-07 DIAGNOSIS — F419 Anxiety disorder, unspecified: Secondary | ICD-10-CM | POA: Diagnosis not present

## 2019-01-07 DIAGNOSIS — J449 Chronic obstructive pulmonary disease, unspecified: Secondary | ICD-10-CM | POA: Diagnosis not present

## 2019-01-07 DIAGNOSIS — N393 Stress incontinence (female) (male): Secondary | ICD-10-CM | POA: Diagnosis not present

## 2019-01-07 DIAGNOSIS — J9612 Chronic respiratory failure with hypercapnia: Secondary | ICD-10-CM | POA: Diagnosis not present

## 2019-01-07 DIAGNOSIS — F329 Major depressive disorder, single episode, unspecified: Secondary | ICD-10-CM | POA: Diagnosis not present

## 2019-01-07 DIAGNOSIS — Z9981 Dependence on supplemental oxygen: Secondary | ICD-10-CM | POA: Diagnosis not present

## 2019-01-07 DIAGNOSIS — K219 Gastro-esophageal reflux disease without esophagitis: Secondary | ICD-10-CM | POA: Diagnosis not present

## 2019-01-10 DIAGNOSIS — E785 Hyperlipidemia, unspecified: Secondary | ICD-10-CM | POA: Diagnosis not present

## 2019-01-10 DIAGNOSIS — M419 Scoliosis, unspecified: Secondary | ICD-10-CM | POA: Diagnosis not present

## 2019-01-10 DIAGNOSIS — J9611 Chronic respiratory failure with hypoxia: Secondary | ICD-10-CM | POA: Diagnosis not present

## 2019-01-10 DIAGNOSIS — I10 Essential (primary) hypertension: Secondary | ICD-10-CM | POA: Diagnosis not present

## 2019-01-10 DIAGNOSIS — J449 Chronic obstructive pulmonary disease, unspecified: Secondary | ICD-10-CM | POA: Diagnosis not present

## 2019-01-10 DIAGNOSIS — J9612 Chronic respiratory failure with hypercapnia: Secondary | ICD-10-CM | POA: Diagnosis not present

## 2019-01-15 DIAGNOSIS — Z23 Encounter for immunization: Secondary | ICD-10-CM | POA: Diagnosis not present

## 2019-01-19 DIAGNOSIS — I10 Essential (primary) hypertension: Secondary | ICD-10-CM | POA: Diagnosis not present

## 2019-01-19 DIAGNOSIS — J9611 Chronic respiratory failure with hypoxia: Secondary | ICD-10-CM | POA: Diagnosis not present

## 2019-01-19 DIAGNOSIS — M419 Scoliosis, unspecified: Secondary | ICD-10-CM | POA: Diagnosis not present

## 2019-01-19 DIAGNOSIS — J449 Chronic obstructive pulmonary disease, unspecified: Secondary | ICD-10-CM | POA: Diagnosis not present

## 2019-01-19 DIAGNOSIS — J9612 Chronic respiratory failure with hypercapnia: Secondary | ICD-10-CM | POA: Diagnosis not present

## 2019-01-19 DIAGNOSIS — E785 Hyperlipidemia, unspecified: Secondary | ICD-10-CM | POA: Diagnosis not present

## 2019-01-28 DIAGNOSIS — M419 Scoliosis, unspecified: Secondary | ICD-10-CM | POA: Diagnosis not present

## 2019-01-28 DIAGNOSIS — J9611 Chronic respiratory failure with hypoxia: Secondary | ICD-10-CM | POA: Diagnosis not present

## 2019-01-28 DIAGNOSIS — J449 Chronic obstructive pulmonary disease, unspecified: Secondary | ICD-10-CM | POA: Diagnosis not present

## 2019-01-28 DIAGNOSIS — J9612 Chronic respiratory failure with hypercapnia: Secondary | ICD-10-CM | POA: Diagnosis not present

## 2019-01-28 DIAGNOSIS — E785 Hyperlipidemia, unspecified: Secondary | ICD-10-CM | POA: Diagnosis not present

## 2019-01-28 DIAGNOSIS — I10 Essential (primary) hypertension: Secondary | ICD-10-CM | POA: Diagnosis not present

## 2019-02-04 DIAGNOSIS — I10 Essential (primary) hypertension: Secondary | ICD-10-CM | POA: Diagnosis not present

## 2019-02-04 DIAGNOSIS — J9611 Chronic respiratory failure with hypoxia: Secondary | ICD-10-CM | POA: Diagnosis not present

## 2019-02-04 DIAGNOSIS — J9612 Chronic respiratory failure with hypercapnia: Secondary | ICD-10-CM | POA: Diagnosis not present

## 2019-02-04 DIAGNOSIS — E785 Hyperlipidemia, unspecified: Secondary | ICD-10-CM | POA: Diagnosis not present

## 2019-02-04 DIAGNOSIS — M419 Scoliosis, unspecified: Secondary | ICD-10-CM | POA: Diagnosis not present

## 2019-02-04 DIAGNOSIS — J449 Chronic obstructive pulmonary disease, unspecified: Secondary | ICD-10-CM | POA: Diagnosis not present

## 2019-02-07 DIAGNOSIS — K219 Gastro-esophageal reflux disease without esophagitis: Secondary | ICD-10-CM | POA: Diagnosis not present

## 2019-02-07 DIAGNOSIS — M810 Age-related osteoporosis without current pathological fracture: Secondary | ICD-10-CM | POA: Diagnosis not present

## 2019-02-07 DIAGNOSIS — N393 Stress incontinence (female) (male): Secondary | ICD-10-CM | POA: Diagnosis not present

## 2019-02-07 DIAGNOSIS — J9611 Chronic respiratory failure with hypoxia: Secondary | ICD-10-CM | POA: Diagnosis not present

## 2019-02-07 DIAGNOSIS — M419 Scoliosis, unspecified: Secondary | ICD-10-CM | POA: Diagnosis not present

## 2019-02-07 DIAGNOSIS — J9612 Chronic respiratory failure with hypercapnia: Secondary | ICD-10-CM | POA: Diagnosis not present

## 2019-02-07 DIAGNOSIS — J449 Chronic obstructive pulmonary disease, unspecified: Secondary | ICD-10-CM | POA: Diagnosis not present

## 2019-02-07 DIAGNOSIS — F419 Anxiety disorder, unspecified: Secondary | ICD-10-CM | POA: Diagnosis not present

## 2019-02-07 DIAGNOSIS — F329 Major depressive disorder, single episode, unspecified: Secondary | ICD-10-CM | POA: Diagnosis not present

## 2019-02-07 DIAGNOSIS — Z9989 Dependence on other enabling machines and devices: Secondary | ICD-10-CM | POA: Diagnosis not present

## 2019-02-07 DIAGNOSIS — J189 Pneumonia, unspecified organism: Secondary | ICD-10-CM | POA: Diagnosis not present

## 2019-02-07 DIAGNOSIS — M549 Dorsalgia, unspecified: Secondary | ICD-10-CM | POA: Diagnosis not present

## 2019-02-07 DIAGNOSIS — M1990 Unspecified osteoarthritis, unspecified site: Secondary | ICD-10-CM | POA: Diagnosis not present

## 2019-02-07 DIAGNOSIS — E785 Hyperlipidemia, unspecified: Secondary | ICD-10-CM | POA: Diagnosis not present

## 2019-02-07 DIAGNOSIS — Z87442 Personal history of urinary calculi: Secondary | ICD-10-CM | POA: Diagnosis not present

## 2019-02-07 DIAGNOSIS — Z9981 Dependence on supplemental oxygen: Secondary | ICD-10-CM | POA: Diagnosis not present

## 2019-02-07 DIAGNOSIS — I10 Essential (primary) hypertension: Secondary | ICD-10-CM | POA: Diagnosis not present

## 2019-02-11 DIAGNOSIS — J9611 Chronic respiratory failure with hypoxia: Secondary | ICD-10-CM | POA: Diagnosis not present

## 2019-02-11 DIAGNOSIS — J9612 Chronic respiratory failure with hypercapnia: Secondary | ICD-10-CM | POA: Diagnosis not present

## 2019-02-11 DIAGNOSIS — I10 Essential (primary) hypertension: Secondary | ICD-10-CM | POA: Diagnosis not present

## 2019-02-11 DIAGNOSIS — E785 Hyperlipidemia, unspecified: Secondary | ICD-10-CM | POA: Diagnosis not present

## 2019-02-11 DIAGNOSIS — J449 Chronic obstructive pulmonary disease, unspecified: Secondary | ICD-10-CM | POA: Diagnosis not present

## 2019-02-11 DIAGNOSIS — M419 Scoliosis, unspecified: Secondary | ICD-10-CM | POA: Diagnosis not present

## 2019-02-19 DIAGNOSIS — I10 Essential (primary) hypertension: Secondary | ICD-10-CM | POA: Diagnosis not present

## 2019-02-19 DIAGNOSIS — J449 Chronic obstructive pulmonary disease, unspecified: Secondary | ICD-10-CM | POA: Diagnosis not present

## 2019-02-19 DIAGNOSIS — J9612 Chronic respiratory failure with hypercapnia: Secondary | ICD-10-CM | POA: Diagnosis not present

## 2019-02-19 DIAGNOSIS — M419 Scoliosis, unspecified: Secondary | ICD-10-CM | POA: Diagnosis not present

## 2019-02-19 DIAGNOSIS — J9611 Chronic respiratory failure with hypoxia: Secondary | ICD-10-CM | POA: Diagnosis not present

## 2019-02-19 DIAGNOSIS — E785 Hyperlipidemia, unspecified: Secondary | ICD-10-CM | POA: Diagnosis not present

## 2019-02-25 DIAGNOSIS — J9611 Chronic respiratory failure with hypoxia: Secondary | ICD-10-CM | POA: Diagnosis not present

## 2019-02-25 DIAGNOSIS — J449 Chronic obstructive pulmonary disease, unspecified: Secondary | ICD-10-CM | POA: Diagnosis not present

## 2019-02-25 DIAGNOSIS — J9612 Chronic respiratory failure with hypercapnia: Secondary | ICD-10-CM | POA: Diagnosis not present

## 2019-02-25 DIAGNOSIS — E785 Hyperlipidemia, unspecified: Secondary | ICD-10-CM | POA: Diagnosis not present

## 2019-02-25 DIAGNOSIS — M419 Scoliosis, unspecified: Secondary | ICD-10-CM | POA: Diagnosis not present

## 2019-02-25 DIAGNOSIS — I10 Essential (primary) hypertension: Secondary | ICD-10-CM | POA: Diagnosis not present

## 2019-03-03 DIAGNOSIS — J449 Chronic obstructive pulmonary disease, unspecified: Secondary | ICD-10-CM | POA: Diagnosis not present

## 2019-03-03 DIAGNOSIS — J9612 Chronic respiratory failure with hypercapnia: Secondary | ICD-10-CM | POA: Diagnosis not present

## 2019-03-03 DIAGNOSIS — I10 Essential (primary) hypertension: Secondary | ICD-10-CM | POA: Diagnosis not present

## 2019-03-03 DIAGNOSIS — E785 Hyperlipidemia, unspecified: Secondary | ICD-10-CM | POA: Diagnosis not present

## 2019-03-03 DIAGNOSIS — J9611 Chronic respiratory failure with hypoxia: Secondary | ICD-10-CM | POA: Diagnosis not present

## 2019-03-03 DIAGNOSIS — M419 Scoliosis, unspecified: Secondary | ICD-10-CM | POA: Diagnosis not present

## 2019-03-09 DIAGNOSIS — Z9981 Dependence on supplemental oxygen: Secondary | ICD-10-CM | POA: Diagnosis not present

## 2019-03-09 DIAGNOSIS — F419 Anxiety disorder, unspecified: Secondary | ICD-10-CM | POA: Diagnosis not present

## 2019-03-09 DIAGNOSIS — M810 Age-related osteoporosis without current pathological fracture: Secondary | ICD-10-CM | POA: Diagnosis not present

## 2019-03-09 DIAGNOSIS — Z87442 Personal history of urinary calculi: Secondary | ICD-10-CM | POA: Diagnosis not present

## 2019-03-09 DIAGNOSIS — I10 Essential (primary) hypertension: Secondary | ICD-10-CM | POA: Diagnosis not present

## 2019-03-09 DIAGNOSIS — Z9989 Dependence on other enabling machines and devices: Secondary | ICD-10-CM | POA: Diagnosis not present

## 2019-03-09 DIAGNOSIS — J449 Chronic obstructive pulmonary disease, unspecified: Secondary | ICD-10-CM | POA: Diagnosis not present

## 2019-03-09 DIAGNOSIS — N393 Stress incontinence (female) (male): Secondary | ICD-10-CM | POA: Diagnosis not present

## 2019-03-09 DIAGNOSIS — M419 Scoliosis, unspecified: Secondary | ICD-10-CM | POA: Diagnosis not present

## 2019-03-09 DIAGNOSIS — M549 Dorsalgia, unspecified: Secondary | ICD-10-CM | POA: Diagnosis not present

## 2019-03-09 DIAGNOSIS — J9611 Chronic respiratory failure with hypoxia: Secondary | ICD-10-CM | POA: Diagnosis not present

## 2019-03-09 DIAGNOSIS — J189 Pneumonia, unspecified organism: Secondary | ICD-10-CM | POA: Diagnosis not present

## 2019-03-09 DIAGNOSIS — E785 Hyperlipidemia, unspecified: Secondary | ICD-10-CM | POA: Diagnosis not present

## 2019-03-09 DIAGNOSIS — J9612 Chronic respiratory failure with hypercapnia: Secondary | ICD-10-CM | POA: Diagnosis not present

## 2019-03-09 DIAGNOSIS — K219 Gastro-esophageal reflux disease without esophagitis: Secondary | ICD-10-CM | POA: Diagnosis not present

## 2019-03-09 DIAGNOSIS — F329 Major depressive disorder, single episode, unspecified: Secondary | ICD-10-CM | POA: Diagnosis not present

## 2019-03-09 DIAGNOSIS — M1990 Unspecified osteoarthritis, unspecified site: Secondary | ICD-10-CM | POA: Diagnosis not present

## 2019-03-12 DIAGNOSIS — E785 Hyperlipidemia, unspecified: Secondary | ICD-10-CM | POA: Diagnosis not present

## 2019-03-12 DIAGNOSIS — I10 Essential (primary) hypertension: Secondary | ICD-10-CM | POA: Diagnosis not present

## 2019-03-12 DIAGNOSIS — J449 Chronic obstructive pulmonary disease, unspecified: Secondary | ICD-10-CM | POA: Diagnosis not present

## 2019-03-12 DIAGNOSIS — M419 Scoliosis, unspecified: Secondary | ICD-10-CM | POA: Diagnosis not present

## 2019-03-12 DIAGNOSIS — J9611 Chronic respiratory failure with hypoxia: Secondary | ICD-10-CM | POA: Diagnosis not present

## 2019-03-12 DIAGNOSIS — J9612 Chronic respiratory failure with hypercapnia: Secondary | ICD-10-CM | POA: Diagnosis not present

## 2019-03-18 DIAGNOSIS — I10 Essential (primary) hypertension: Secondary | ICD-10-CM | POA: Diagnosis not present

## 2019-03-18 DIAGNOSIS — J449 Chronic obstructive pulmonary disease, unspecified: Secondary | ICD-10-CM | POA: Diagnosis not present

## 2019-03-18 DIAGNOSIS — M419 Scoliosis, unspecified: Secondary | ICD-10-CM | POA: Diagnosis not present

## 2019-03-18 DIAGNOSIS — E785 Hyperlipidemia, unspecified: Secondary | ICD-10-CM | POA: Diagnosis not present

## 2019-03-18 DIAGNOSIS — J9611 Chronic respiratory failure with hypoxia: Secondary | ICD-10-CM | POA: Diagnosis not present

## 2019-03-18 DIAGNOSIS — J9612 Chronic respiratory failure with hypercapnia: Secondary | ICD-10-CM | POA: Diagnosis not present

## 2019-03-19 DIAGNOSIS — J9612 Chronic respiratory failure with hypercapnia: Secondary | ICD-10-CM | POA: Diagnosis not present

## 2019-03-19 DIAGNOSIS — M419 Scoliosis, unspecified: Secondary | ICD-10-CM | POA: Diagnosis not present

## 2019-03-19 DIAGNOSIS — J449 Chronic obstructive pulmonary disease, unspecified: Secondary | ICD-10-CM | POA: Diagnosis not present

## 2019-03-19 DIAGNOSIS — E785 Hyperlipidemia, unspecified: Secondary | ICD-10-CM | POA: Diagnosis not present

## 2019-03-19 DIAGNOSIS — J9611 Chronic respiratory failure with hypoxia: Secondary | ICD-10-CM | POA: Diagnosis not present

## 2019-03-19 DIAGNOSIS — I10 Essential (primary) hypertension: Secondary | ICD-10-CM | POA: Diagnosis not present

## 2019-03-25 DIAGNOSIS — J9611 Chronic respiratory failure with hypoxia: Secondary | ICD-10-CM | POA: Diagnosis not present

## 2019-03-25 DIAGNOSIS — I10 Essential (primary) hypertension: Secondary | ICD-10-CM | POA: Diagnosis not present

## 2019-03-25 DIAGNOSIS — J9612 Chronic respiratory failure with hypercapnia: Secondary | ICD-10-CM | POA: Diagnosis not present

## 2019-03-25 DIAGNOSIS — J449 Chronic obstructive pulmonary disease, unspecified: Secondary | ICD-10-CM | POA: Diagnosis not present

## 2019-03-25 DIAGNOSIS — E785 Hyperlipidemia, unspecified: Secondary | ICD-10-CM | POA: Diagnosis not present

## 2019-03-25 DIAGNOSIS — M419 Scoliosis, unspecified: Secondary | ICD-10-CM | POA: Diagnosis not present

## 2019-03-31 DIAGNOSIS — E785 Hyperlipidemia, unspecified: Secondary | ICD-10-CM | POA: Diagnosis not present

## 2019-03-31 DIAGNOSIS — J9612 Chronic respiratory failure with hypercapnia: Secondary | ICD-10-CM | POA: Diagnosis not present

## 2019-03-31 DIAGNOSIS — J9611 Chronic respiratory failure with hypoxia: Secondary | ICD-10-CM | POA: Diagnosis not present

## 2019-03-31 DIAGNOSIS — I10 Essential (primary) hypertension: Secondary | ICD-10-CM | POA: Diagnosis not present

## 2019-03-31 DIAGNOSIS — J449 Chronic obstructive pulmonary disease, unspecified: Secondary | ICD-10-CM | POA: Diagnosis not present

## 2019-03-31 DIAGNOSIS — M419 Scoliosis, unspecified: Secondary | ICD-10-CM | POA: Diagnosis not present

## 2019-04-08 DIAGNOSIS — E785 Hyperlipidemia, unspecified: Secondary | ICD-10-CM | POA: Diagnosis not present

## 2019-04-08 DIAGNOSIS — I10 Essential (primary) hypertension: Secondary | ICD-10-CM | POA: Diagnosis not present

## 2019-04-08 DIAGNOSIS — M419 Scoliosis, unspecified: Secondary | ICD-10-CM | POA: Diagnosis not present

## 2019-04-08 DIAGNOSIS — J9611 Chronic respiratory failure with hypoxia: Secondary | ICD-10-CM | POA: Diagnosis not present

## 2019-04-08 DIAGNOSIS — J449 Chronic obstructive pulmonary disease, unspecified: Secondary | ICD-10-CM | POA: Diagnosis not present

## 2019-04-08 DIAGNOSIS — J9612 Chronic respiratory failure with hypercapnia: Secondary | ICD-10-CM | POA: Diagnosis not present

## 2019-04-09 DIAGNOSIS — J9611 Chronic respiratory failure with hypoxia: Secondary | ICD-10-CM | POA: Diagnosis not present

## 2019-04-09 DIAGNOSIS — J9612 Chronic respiratory failure with hypercapnia: Secondary | ICD-10-CM | POA: Diagnosis not present

## 2019-04-09 DIAGNOSIS — R0601 Orthopnea: Secondary | ICD-10-CM | POA: Diagnosis not present

## 2019-04-09 DIAGNOSIS — Z87442 Personal history of urinary calculi: Secondary | ICD-10-CM | POA: Diagnosis not present

## 2019-04-09 DIAGNOSIS — E785 Hyperlipidemia, unspecified: Secondary | ICD-10-CM | POA: Diagnosis not present

## 2019-04-09 DIAGNOSIS — F17290 Nicotine dependence, other tobacco product, uncomplicated: Secondary | ICD-10-CM | POA: Diagnosis not present

## 2019-04-09 DIAGNOSIS — K219 Gastro-esophageal reflux disease without esophagitis: Secondary | ICD-10-CM | POA: Diagnosis not present

## 2019-04-09 DIAGNOSIS — Z9989 Dependence on other enabling machines and devices: Secondary | ICD-10-CM | POA: Diagnosis not present

## 2019-04-09 DIAGNOSIS — J449 Chronic obstructive pulmonary disease, unspecified: Secondary | ICD-10-CM | POA: Diagnosis not present

## 2019-04-09 DIAGNOSIS — N393 Stress incontinence (female) (male): Secondary | ICD-10-CM | POA: Diagnosis not present

## 2019-04-09 DIAGNOSIS — M1990 Unspecified osteoarthritis, unspecified site: Secondary | ICD-10-CM | POA: Diagnosis not present

## 2019-04-09 DIAGNOSIS — J189 Pneumonia, unspecified organism: Secondary | ICD-10-CM | POA: Diagnosis not present

## 2019-04-09 DIAGNOSIS — M549 Dorsalgia, unspecified: Secondary | ICD-10-CM | POA: Diagnosis not present

## 2019-04-09 DIAGNOSIS — Z9981 Dependence on supplemental oxygen: Secondary | ICD-10-CM | POA: Diagnosis not present

## 2019-04-09 DIAGNOSIS — F329 Major depressive disorder, single episode, unspecified: Secondary | ICD-10-CM | POA: Diagnosis not present

## 2019-04-09 DIAGNOSIS — I10 Essential (primary) hypertension: Secondary | ICD-10-CM | POA: Diagnosis not present

## 2019-04-09 DIAGNOSIS — M810 Age-related osteoporosis without current pathological fracture: Secondary | ICD-10-CM | POA: Diagnosis not present

## 2019-04-09 DIAGNOSIS — M419 Scoliosis, unspecified: Secondary | ICD-10-CM | POA: Diagnosis not present

## 2019-04-09 DIAGNOSIS — R230 Cyanosis: Secondary | ICD-10-CM | POA: Diagnosis not present

## 2019-04-09 DIAGNOSIS — F419 Anxiety disorder, unspecified: Secondary | ICD-10-CM | POA: Diagnosis not present

## 2019-04-13 DIAGNOSIS — H547 Unspecified visual loss: Secondary | ICD-10-CM | POA: Diagnosis not present

## 2019-04-14 DIAGNOSIS — J9612 Chronic respiratory failure with hypercapnia: Secondary | ICD-10-CM | POA: Diagnosis not present

## 2019-04-14 DIAGNOSIS — M419 Scoliosis, unspecified: Secondary | ICD-10-CM | POA: Diagnosis not present

## 2019-04-14 DIAGNOSIS — I10 Essential (primary) hypertension: Secondary | ICD-10-CM | POA: Diagnosis not present

## 2019-04-14 DIAGNOSIS — E785 Hyperlipidemia, unspecified: Secondary | ICD-10-CM | POA: Diagnosis not present

## 2019-04-14 DIAGNOSIS — J9611 Chronic respiratory failure with hypoxia: Secondary | ICD-10-CM | POA: Diagnosis not present

## 2019-04-14 DIAGNOSIS — J449 Chronic obstructive pulmonary disease, unspecified: Secondary | ICD-10-CM | POA: Diagnosis not present

## 2019-04-15 DIAGNOSIS — I10 Essential (primary) hypertension: Secondary | ICD-10-CM | POA: Diagnosis not present

## 2019-04-15 DIAGNOSIS — J9611 Chronic respiratory failure with hypoxia: Secondary | ICD-10-CM | POA: Diagnosis not present

## 2019-04-15 DIAGNOSIS — M419 Scoliosis, unspecified: Secondary | ICD-10-CM | POA: Diagnosis not present

## 2019-04-15 DIAGNOSIS — E785 Hyperlipidemia, unspecified: Secondary | ICD-10-CM | POA: Diagnosis not present

## 2019-04-15 DIAGNOSIS — J449 Chronic obstructive pulmonary disease, unspecified: Secondary | ICD-10-CM | POA: Diagnosis not present

## 2019-04-15 DIAGNOSIS — J9612 Chronic respiratory failure with hypercapnia: Secondary | ICD-10-CM | POA: Diagnosis not present

## 2019-04-16 DIAGNOSIS — J9611 Chronic respiratory failure with hypoxia: Secondary | ICD-10-CM | POA: Diagnosis not present

## 2019-04-16 DIAGNOSIS — I10 Essential (primary) hypertension: Secondary | ICD-10-CM | POA: Diagnosis not present

## 2019-04-16 DIAGNOSIS — J449 Chronic obstructive pulmonary disease, unspecified: Secondary | ICD-10-CM | POA: Diagnosis not present

## 2019-04-16 DIAGNOSIS — J9612 Chronic respiratory failure with hypercapnia: Secondary | ICD-10-CM | POA: Diagnosis not present

## 2019-04-16 DIAGNOSIS — E785 Hyperlipidemia, unspecified: Secondary | ICD-10-CM | POA: Diagnosis not present

## 2019-04-16 DIAGNOSIS — M419 Scoliosis, unspecified: Secondary | ICD-10-CM | POA: Diagnosis not present

## 2019-04-22 DIAGNOSIS — J9612 Chronic respiratory failure with hypercapnia: Secondary | ICD-10-CM | POA: Diagnosis not present

## 2019-04-22 DIAGNOSIS — J9611 Chronic respiratory failure with hypoxia: Secondary | ICD-10-CM | POA: Diagnosis not present

## 2019-04-22 DIAGNOSIS — E785 Hyperlipidemia, unspecified: Secondary | ICD-10-CM | POA: Diagnosis not present

## 2019-04-22 DIAGNOSIS — J449 Chronic obstructive pulmonary disease, unspecified: Secondary | ICD-10-CM | POA: Diagnosis not present

## 2019-04-22 DIAGNOSIS — I10 Essential (primary) hypertension: Secondary | ICD-10-CM | POA: Diagnosis not present

## 2019-04-22 DIAGNOSIS — M419 Scoliosis, unspecified: Secondary | ICD-10-CM | POA: Diagnosis not present

## 2019-04-29 DIAGNOSIS — E785 Hyperlipidemia, unspecified: Secondary | ICD-10-CM | POA: Diagnosis not present

## 2019-04-29 DIAGNOSIS — J9611 Chronic respiratory failure with hypoxia: Secondary | ICD-10-CM | POA: Diagnosis not present

## 2019-04-29 DIAGNOSIS — M419 Scoliosis, unspecified: Secondary | ICD-10-CM | POA: Diagnosis not present

## 2019-04-29 DIAGNOSIS — J449 Chronic obstructive pulmonary disease, unspecified: Secondary | ICD-10-CM | POA: Diagnosis not present

## 2019-04-29 DIAGNOSIS — I10 Essential (primary) hypertension: Secondary | ICD-10-CM | POA: Diagnosis not present

## 2019-04-29 DIAGNOSIS — J9612 Chronic respiratory failure with hypercapnia: Secondary | ICD-10-CM | POA: Diagnosis not present

## 2019-05-06 DIAGNOSIS — H53413 Scotoma involving central area, bilateral: Secondary | ICD-10-CM | POA: Diagnosis not present

## 2019-05-07 DIAGNOSIS — I10 Essential (primary) hypertension: Secondary | ICD-10-CM | POA: Diagnosis not present

## 2019-05-07 DIAGNOSIS — E785 Hyperlipidemia, unspecified: Secondary | ICD-10-CM | POA: Diagnosis not present

## 2019-05-07 DIAGNOSIS — J9612 Chronic respiratory failure with hypercapnia: Secondary | ICD-10-CM | POA: Diagnosis not present

## 2019-05-07 DIAGNOSIS — J449 Chronic obstructive pulmonary disease, unspecified: Secondary | ICD-10-CM | POA: Diagnosis not present

## 2019-05-07 DIAGNOSIS — J9611 Chronic respiratory failure with hypoxia: Secondary | ICD-10-CM | POA: Diagnosis not present

## 2019-05-07 DIAGNOSIS — M419 Scoliosis, unspecified: Secondary | ICD-10-CM | POA: Diagnosis not present

## 2019-05-10 DIAGNOSIS — I10 Essential (primary) hypertension: Secondary | ICD-10-CM | POA: Diagnosis not present

## 2019-05-10 DIAGNOSIS — F419 Anxiety disorder, unspecified: Secondary | ICD-10-CM | POA: Diagnosis not present

## 2019-05-10 DIAGNOSIS — F17290 Nicotine dependence, other tobacco product, uncomplicated: Secondary | ICD-10-CM | POA: Diagnosis not present

## 2019-05-10 DIAGNOSIS — Z9981 Dependence on supplemental oxygen: Secondary | ICD-10-CM | POA: Diagnosis not present

## 2019-05-10 DIAGNOSIS — Z9989 Dependence on other enabling machines and devices: Secondary | ICD-10-CM | POA: Diagnosis not present

## 2019-05-10 DIAGNOSIS — J9611 Chronic respiratory failure with hypoxia: Secondary | ICD-10-CM | POA: Diagnosis not present

## 2019-05-10 DIAGNOSIS — E785 Hyperlipidemia, unspecified: Secondary | ICD-10-CM | POA: Diagnosis not present

## 2019-05-10 DIAGNOSIS — M810 Age-related osteoporosis without current pathological fracture: Secondary | ICD-10-CM | POA: Diagnosis not present

## 2019-05-10 DIAGNOSIS — M419 Scoliosis, unspecified: Secondary | ICD-10-CM | POA: Diagnosis not present

## 2019-05-10 DIAGNOSIS — R230 Cyanosis: Secondary | ICD-10-CM | POA: Diagnosis not present

## 2019-05-10 DIAGNOSIS — F329 Major depressive disorder, single episode, unspecified: Secondary | ICD-10-CM | POA: Diagnosis not present

## 2019-05-10 DIAGNOSIS — M1990 Unspecified osteoarthritis, unspecified site: Secondary | ICD-10-CM | POA: Diagnosis not present

## 2019-05-10 DIAGNOSIS — J189 Pneumonia, unspecified organism: Secondary | ICD-10-CM | POA: Diagnosis not present

## 2019-05-10 DIAGNOSIS — J9612 Chronic respiratory failure with hypercapnia: Secondary | ICD-10-CM | POA: Diagnosis not present

## 2019-05-10 DIAGNOSIS — M549 Dorsalgia, unspecified: Secondary | ICD-10-CM | POA: Diagnosis not present

## 2019-05-10 DIAGNOSIS — N393 Stress incontinence (female) (male): Secondary | ICD-10-CM | POA: Diagnosis not present

## 2019-05-10 DIAGNOSIS — Z87442 Personal history of urinary calculi: Secondary | ICD-10-CM | POA: Diagnosis not present

## 2019-05-10 DIAGNOSIS — J449 Chronic obstructive pulmonary disease, unspecified: Secondary | ICD-10-CM | POA: Diagnosis not present

## 2019-05-10 DIAGNOSIS — R0601 Orthopnea: Secondary | ICD-10-CM | POA: Diagnosis not present

## 2019-05-10 DIAGNOSIS — K219 Gastro-esophageal reflux disease without esophagitis: Secondary | ICD-10-CM | POA: Diagnosis not present

## 2019-05-13 DIAGNOSIS — J9611 Chronic respiratory failure with hypoxia: Secondary | ICD-10-CM | POA: Diagnosis not present

## 2019-05-13 DIAGNOSIS — E785 Hyperlipidemia, unspecified: Secondary | ICD-10-CM | POA: Diagnosis not present

## 2019-05-13 DIAGNOSIS — H47293 Other optic atrophy, bilateral: Secondary | ICD-10-CM | POA: Diagnosis not present

## 2019-05-13 DIAGNOSIS — J9612 Chronic respiratory failure with hypercapnia: Secondary | ICD-10-CM | POA: Diagnosis not present

## 2019-05-13 DIAGNOSIS — I10 Essential (primary) hypertension: Secondary | ICD-10-CM | POA: Diagnosis not present

## 2019-05-13 DIAGNOSIS — J449 Chronic obstructive pulmonary disease, unspecified: Secondary | ICD-10-CM | POA: Diagnosis not present

## 2019-05-13 DIAGNOSIS — H547 Unspecified visual loss: Secondary | ICD-10-CM | POA: Diagnosis not present

## 2019-05-13 DIAGNOSIS — M419 Scoliosis, unspecified: Secondary | ICD-10-CM | POA: Diagnosis not present

## 2019-05-14 DIAGNOSIS — E785 Hyperlipidemia, unspecified: Secondary | ICD-10-CM | POA: Diagnosis not present

## 2019-05-14 DIAGNOSIS — M419 Scoliosis, unspecified: Secondary | ICD-10-CM | POA: Diagnosis not present

## 2019-05-14 DIAGNOSIS — I10 Essential (primary) hypertension: Secondary | ICD-10-CM | POA: Diagnosis not present

## 2019-05-14 DIAGNOSIS — J9611 Chronic respiratory failure with hypoxia: Secondary | ICD-10-CM | POA: Diagnosis not present

## 2019-05-14 DIAGNOSIS — J9612 Chronic respiratory failure with hypercapnia: Secondary | ICD-10-CM | POA: Diagnosis not present

## 2019-05-14 DIAGNOSIS — J449 Chronic obstructive pulmonary disease, unspecified: Secondary | ICD-10-CM | POA: Diagnosis not present

## 2019-05-15 DIAGNOSIS — E785 Hyperlipidemia, unspecified: Secondary | ICD-10-CM | POA: Diagnosis not present

## 2019-05-15 DIAGNOSIS — J449 Chronic obstructive pulmonary disease, unspecified: Secondary | ICD-10-CM | POA: Diagnosis not present

## 2019-05-15 DIAGNOSIS — J9612 Chronic respiratory failure with hypercapnia: Secondary | ICD-10-CM | POA: Diagnosis not present

## 2019-05-15 DIAGNOSIS — M419 Scoliosis, unspecified: Secondary | ICD-10-CM | POA: Diagnosis not present

## 2019-05-15 DIAGNOSIS — J9611 Chronic respiratory failure with hypoxia: Secondary | ICD-10-CM | POA: Diagnosis not present

## 2019-05-15 DIAGNOSIS — I10 Essential (primary) hypertension: Secondary | ICD-10-CM | POA: Diagnosis not present

## 2019-05-16 DIAGNOSIS — E785 Hyperlipidemia, unspecified: Secondary | ICD-10-CM | POA: Diagnosis not present

## 2019-05-16 DIAGNOSIS — J9612 Chronic respiratory failure with hypercapnia: Secondary | ICD-10-CM | POA: Diagnosis not present

## 2019-05-16 DIAGNOSIS — I10 Essential (primary) hypertension: Secondary | ICD-10-CM | POA: Diagnosis not present

## 2019-05-16 DIAGNOSIS — J449 Chronic obstructive pulmonary disease, unspecified: Secondary | ICD-10-CM | POA: Diagnosis not present

## 2019-05-16 DIAGNOSIS — M419 Scoliosis, unspecified: Secondary | ICD-10-CM | POA: Diagnosis not present

## 2019-05-16 DIAGNOSIS — J9611 Chronic respiratory failure with hypoxia: Secondary | ICD-10-CM | POA: Diagnosis not present

## 2019-05-17 DIAGNOSIS — E785 Hyperlipidemia, unspecified: Secondary | ICD-10-CM | POA: Diagnosis not present

## 2019-05-17 DIAGNOSIS — J449 Chronic obstructive pulmonary disease, unspecified: Secondary | ICD-10-CM | POA: Diagnosis not present

## 2019-05-17 DIAGNOSIS — J9612 Chronic respiratory failure with hypercapnia: Secondary | ICD-10-CM | POA: Diagnosis not present

## 2019-05-17 DIAGNOSIS — I10 Essential (primary) hypertension: Secondary | ICD-10-CM | POA: Diagnosis not present

## 2019-05-17 DIAGNOSIS — J9611 Chronic respiratory failure with hypoxia: Secondary | ICD-10-CM | POA: Diagnosis not present

## 2019-05-17 DIAGNOSIS — M419 Scoliosis, unspecified: Secondary | ICD-10-CM | POA: Diagnosis not present

## 2019-05-20 DIAGNOSIS — J9611 Chronic respiratory failure with hypoxia: Secondary | ICD-10-CM | POA: Diagnosis not present

## 2019-05-20 DIAGNOSIS — E785 Hyperlipidemia, unspecified: Secondary | ICD-10-CM | POA: Diagnosis not present

## 2019-05-20 DIAGNOSIS — J9612 Chronic respiratory failure with hypercapnia: Secondary | ICD-10-CM | POA: Diagnosis not present

## 2019-05-20 DIAGNOSIS — I10 Essential (primary) hypertension: Secondary | ICD-10-CM | POA: Diagnosis not present

## 2019-05-20 DIAGNOSIS — M419 Scoliosis, unspecified: Secondary | ICD-10-CM | POA: Diagnosis not present

## 2019-05-20 DIAGNOSIS — J449 Chronic obstructive pulmonary disease, unspecified: Secondary | ICD-10-CM | POA: Diagnosis not present

## 2019-05-21 ENCOUNTER — Ambulatory Visit

## 2019-05-21 DIAGNOSIS — I10 Essential (primary) hypertension: Secondary | ICD-10-CM | POA: Diagnosis not present

## 2019-05-21 DIAGNOSIS — J9611 Chronic respiratory failure with hypoxia: Secondary | ICD-10-CM | POA: Diagnosis not present

## 2019-05-21 DIAGNOSIS — J9612 Chronic respiratory failure with hypercapnia: Secondary | ICD-10-CM | POA: Diagnosis not present

## 2019-05-21 DIAGNOSIS — J449 Chronic obstructive pulmonary disease, unspecified: Secondary | ICD-10-CM | POA: Diagnosis not present

## 2019-05-21 DIAGNOSIS — M419 Scoliosis, unspecified: Secondary | ICD-10-CM | POA: Diagnosis not present

## 2019-05-21 DIAGNOSIS — E785 Hyperlipidemia, unspecified: Secondary | ICD-10-CM | POA: Diagnosis not present

## 2019-05-27 DIAGNOSIS — J9611 Chronic respiratory failure with hypoxia: Secondary | ICD-10-CM | POA: Diagnosis not present

## 2019-05-27 DIAGNOSIS — J449 Chronic obstructive pulmonary disease, unspecified: Secondary | ICD-10-CM | POA: Diagnosis not present

## 2019-05-27 DIAGNOSIS — E785 Hyperlipidemia, unspecified: Secondary | ICD-10-CM | POA: Diagnosis not present

## 2019-05-27 DIAGNOSIS — J9612 Chronic respiratory failure with hypercapnia: Secondary | ICD-10-CM | POA: Diagnosis not present

## 2019-05-27 DIAGNOSIS — M419 Scoliosis, unspecified: Secondary | ICD-10-CM | POA: Diagnosis not present

## 2019-05-27 DIAGNOSIS — I10 Essential (primary) hypertension: Secondary | ICD-10-CM | POA: Diagnosis not present

## 2019-06-03 DIAGNOSIS — J9612 Chronic respiratory failure with hypercapnia: Secondary | ICD-10-CM | POA: Diagnosis not present

## 2019-06-03 DIAGNOSIS — E785 Hyperlipidemia, unspecified: Secondary | ICD-10-CM | POA: Diagnosis not present

## 2019-06-03 DIAGNOSIS — I10 Essential (primary) hypertension: Secondary | ICD-10-CM | POA: Diagnosis not present

## 2019-06-03 DIAGNOSIS — M419 Scoliosis, unspecified: Secondary | ICD-10-CM | POA: Diagnosis not present

## 2019-06-03 DIAGNOSIS — J449 Chronic obstructive pulmonary disease, unspecified: Secondary | ICD-10-CM | POA: Diagnosis not present

## 2019-06-03 DIAGNOSIS — J9611 Chronic respiratory failure with hypoxia: Secondary | ICD-10-CM | POA: Diagnosis not present

## 2019-06-07 DIAGNOSIS — J449 Chronic obstructive pulmonary disease, unspecified: Secondary | ICD-10-CM | POA: Diagnosis not present

## 2019-06-07 DIAGNOSIS — I10 Essential (primary) hypertension: Secondary | ICD-10-CM | POA: Diagnosis not present

## 2019-06-07 DIAGNOSIS — F329 Major depressive disorder, single episode, unspecified: Secondary | ICD-10-CM | POA: Diagnosis not present

## 2019-06-07 DIAGNOSIS — M1990 Unspecified osteoarthritis, unspecified site: Secondary | ICD-10-CM | POA: Diagnosis not present

## 2019-06-07 DIAGNOSIS — N393 Stress incontinence (female) (male): Secondary | ICD-10-CM | POA: Diagnosis not present

## 2019-06-07 DIAGNOSIS — M419 Scoliosis, unspecified: Secondary | ICD-10-CM | POA: Diagnosis not present

## 2019-06-07 DIAGNOSIS — R0601 Orthopnea: Secondary | ICD-10-CM | POA: Diagnosis not present

## 2019-06-07 DIAGNOSIS — E785 Hyperlipidemia, unspecified: Secondary | ICD-10-CM | POA: Diagnosis not present

## 2019-06-07 DIAGNOSIS — Z9989 Dependence on other enabling machines and devices: Secondary | ICD-10-CM | POA: Diagnosis not present

## 2019-06-07 DIAGNOSIS — H469 Unspecified optic neuritis: Secondary | ICD-10-CM | POA: Diagnosis not present

## 2019-06-07 DIAGNOSIS — J189 Pneumonia, unspecified organism: Secondary | ICD-10-CM | POA: Diagnosis not present

## 2019-06-07 DIAGNOSIS — Z9981 Dependence on supplemental oxygen: Secondary | ICD-10-CM | POA: Diagnosis not present

## 2019-06-07 DIAGNOSIS — Z87442 Personal history of urinary calculi: Secondary | ICD-10-CM | POA: Diagnosis not present

## 2019-06-07 DIAGNOSIS — M810 Age-related osteoporosis without current pathological fracture: Secondary | ICD-10-CM | POA: Diagnosis not present

## 2019-06-07 DIAGNOSIS — M549 Dorsalgia, unspecified: Secondary | ICD-10-CM | POA: Diagnosis not present

## 2019-06-07 DIAGNOSIS — F419 Anxiety disorder, unspecified: Secondary | ICD-10-CM | POA: Diagnosis not present

## 2019-06-07 DIAGNOSIS — F17290 Nicotine dependence, other tobacco product, uncomplicated: Secondary | ICD-10-CM | POA: Diagnosis not present

## 2019-06-07 DIAGNOSIS — J9612 Chronic respiratory failure with hypercapnia: Secondary | ICD-10-CM | POA: Diagnosis not present

## 2019-06-07 DIAGNOSIS — J9611 Chronic respiratory failure with hypoxia: Secondary | ICD-10-CM | POA: Diagnosis not present

## 2019-06-07 DIAGNOSIS — R230 Cyanosis: Secondary | ICD-10-CM | POA: Diagnosis not present

## 2019-06-07 DIAGNOSIS — K219 Gastro-esophageal reflux disease without esophagitis: Secondary | ICD-10-CM | POA: Diagnosis not present

## 2019-06-09 DIAGNOSIS — J449 Chronic obstructive pulmonary disease, unspecified: Secondary | ICD-10-CM | POA: Diagnosis not present

## 2019-06-09 DIAGNOSIS — J9611 Chronic respiratory failure with hypoxia: Secondary | ICD-10-CM | POA: Diagnosis not present

## 2019-06-09 DIAGNOSIS — E785 Hyperlipidemia, unspecified: Secondary | ICD-10-CM | POA: Diagnosis not present

## 2019-06-09 DIAGNOSIS — J9612 Chronic respiratory failure with hypercapnia: Secondary | ICD-10-CM | POA: Diagnosis not present

## 2019-06-09 DIAGNOSIS — M419 Scoliosis, unspecified: Secondary | ICD-10-CM | POA: Diagnosis not present

## 2019-06-09 DIAGNOSIS — I10 Essential (primary) hypertension: Secondary | ICD-10-CM | POA: Diagnosis not present

## 2019-06-10 DIAGNOSIS — J449 Chronic obstructive pulmonary disease, unspecified: Secondary | ICD-10-CM | POA: Diagnosis not present

## 2019-06-10 DIAGNOSIS — E785 Hyperlipidemia, unspecified: Secondary | ICD-10-CM | POA: Diagnosis not present

## 2019-06-10 DIAGNOSIS — I10 Essential (primary) hypertension: Secondary | ICD-10-CM | POA: Diagnosis not present

## 2019-06-10 DIAGNOSIS — J9611 Chronic respiratory failure with hypoxia: Secondary | ICD-10-CM | POA: Diagnosis not present

## 2019-06-10 DIAGNOSIS — M419 Scoliosis, unspecified: Secondary | ICD-10-CM | POA: Diagnosis not present

## 2019-06-10 DIAGNOSIS — J9612 Chronic respiratory failure with hypercapnia: Secondary | ICD-10-CM | POA: Diagnosis not present

## 2019-06-12 DIAGNOSIS — M419 Scoliosis, unspecified: Secondary | ICD-10-CM | POA: Diagnosis not present

## 2019-06-12 DIAGNOSIS — J9612 Chronic respiratory failure with hypercapnia: Secondary | ICD-10-CM | POA: Diagnosis not present

## 2019-06-12 DIAGNOSIS — E785 Hyperlipidemia, unspecified: Secondary | ICD-10-CM | POA: Diagnosis not present

## 2019-06-12 DIAGNOSIS — I10 Essential (primary) hypertension: Secondary | ICD-10-CM | POA: Diagnosis not present

## 2019-06-12 DIAGNOSIS — J449 Chronic obstructive pulmonary disease, unspecified: Secondary | ICD-10-CM | POA: Diagnosis not present

## 2019-06-12 DIAGNOSIS — J9611 Chronic respiratory failure with hypoxia: Secondary | ICD-10-CM | POA: Diagnosis not present

## 2019-06-13 DIAGNOSIS — E785 Hyperlipidemia, unspecified: Secondary | ICD-10-CM | POA: Diagnosis not present

## 2019-06-13 DIAGNOSIS — J9611 Chronic respiratory failure with hypoxia: Secondary | ICD-10-CM | POA: Diagnosis not present

## 2019-06-13 DIAGNOSIS — I10 Essential (primary) hypertension: Secondary | ICD-10-CM | POA: Diagnosis not present

## 2019-06-13 DIAGNOSIS — J449 Chronic obstructive pulmonary disease, unspecified: Secondary | ICD-10-CM | POA: Diagnosis not present

## 2019-06-13 DIAGNOSIS — M419 Scoliosis, unspecified: Secondary | ICD-10-CM | POA: Diagnosis not present

## 2019-06-13 DIAGNOSIS — J9612 Chronic respiratory failure with hypercapnia: Secondary | ICD-10-CM | POA: Diagnosis not present

## 2019-07-08 DEATH — deceased
